# Patient Record
Sex: Female | Born: 1985 | Race: Asian | Marital: Married | Smoking: Never smoker
Health system: Southern US, Community
[De-identification: ages and names within clinical notes are randomized; demographics above are authoritative.]

## PROBLEM LIST (undated history)

## (undated) DIAGNOSIS — K219 Gastro-esophageal reflux disease without esophagitis: Secondary | ICD-10-CM

## (undated) DIAGNOSIS — O3502X0 Maternal care for (suspected) central nervous system malformation or damage in fetus, anencephaly, not applicable or unspecified: Secondary | ICD-10-CM

## (undated) DIAGNOSIS — O350XX0 Maternal care for (suspected) central nervous system malformation in fetus, not applicable or unspecified: Secondary | ICD-10-CM

## (undated) HISTORY — DX: Gastro-esophageal reflux disease without esophagitis: K21.9

## (undated) HISTORY — DX: Maternal care for (suspected) central nervous system malformation in fetus, not applicable or unspecified: O35.0XX0

## (undated) HISTORY — DX: Maternal care for (suspected) central nervous system malformation or damage in fetus, anencephaly, not applicable or unspecified: O35.02X0

## (undated) HISTORY — PX: DILATION AND CURETTAGE OF UTERUS: SHX78

---

## 2020-03-18 ENCOUNTER — Inpatient Hospital Stay (HOSPITAL_COMMUNITY): Admit: 2020-03-18 | Payer: 59 | Admitting: Obstetrics and Gynecology

## 2020-04-13 ENCOUNTER — Other Ambulatory Visit: Payer: Self-pay | Admitting: Obstetrics & Gynecology

## 2020-04-13 DIAGNOSIS — O283 Abnormal ultrasonic finding on antenatal screening of mother: Secondary | ICD-10-CM

## 2020-04-23 ENCOUNTER — Encounter: Payer: Self-pay | Admitting: *Deleted

## 2020-04-27 ENCOUNTER — Ambulatory Visit (HOSPITAL_BASED_OUTPATIENT_CLINIC_OR_DEPARTMENT_OTHER): Payer: 59 | Admitting: Maternal & Fetal Medicine

## 2020-04-27 ENCOUNTER — Ambulatory Visit: Payer: 59 | Admitting: *Deleted

## 2020-04-27 ENCOUNTER — Ambulatory Visit: Payer: 59 | Attending: Obstetrics & Gynecology

## 2020-04-27 ENCOUNTER — Encounter: Payer: Self-pay | Admitting: *Deleted

## 2020-04-27 ENCOUNTER — Other Ambulatory Visit: Payer: Self-pay

## 2020-04-27 VITALS — BP 104/68 | HR 104 | Ht 60.0 in

## 2020-04-27 DIAGNOSIS — O368930 Maternal care for other specified fetal problems, third trimester, not applicable or unspecified: Secondary | ICD-10-CM | POA: Diagnosis not present

## 2020-04-27 DIAGNOSIS — O09292 Supervision of pregnancy with other poor reproductive or obstetric history, second trimester: Secondary | ICD-10-CM | POA: Diagnosis not present

## 2020-04-27 DIAGNOSIS — Z148 Genetic carrier of other disease: Secondary | ICD-10-CM

## 2020-04-27 DIAGNOSIS — Q270 Congenital absence and hypoplasia of umbilical artery: Secondary | ICD-10-CM

## 2020-04-27 DIAGNOSIS — O09899 Supervision of other high risk pregnancies, unspecified trimester: Secondary | ICD-10-CM | POA: Insufficient documentation

## 2020-04-27 DIAGNOSIS — O283 Abnormal ultrasonic finding on antenatal screening of mother: Secondary | ICD-10-CM | POA: Insufficient documentation

## 2020-04-27 DIAGNOSIS — O359XX0 Maternal care for (suspected) fetal abnormality and damage, unspecified, not applicable or unspecified: Secondary | ICD-10-CM

## 2020-04-27 DIAGNOSIS — Z3A21 21 weeks gestation of pregnancy: Secondary | ICD-10-CM

## 2020-04-27 NOTE — Progress Notes (Signed)
MFM Consultation  Date of Service: 04/27/20 Reason for request: Ultrasound findings of choroid plexus cyst, echogenic intracardiac focus and single umbilical artery Referring provider: Mikey Kirschner, MD  Alyssa Rich is a 34 yo G3P0 at 32 w 1 d dated by LMP consistent with a 15 ultrasound. She is seen in consultation at the request of Dr. Layla Barter.  She is overall doing well without complaints. She denies preterm labor or preeclampsia.  She had a low risk NIPS and AFP.  She had a prior pregnancy first pregnancy diagnosed with anencephaly. Her subsequent pregnancy ended with an SAB. She had no terminations.   Vitals with BMI 04/27/2020 04/23/2020  Height - 5\' 0"   Systolic 104 -  Diastolic 68 -  Pulse 104 -   OB History  Gravida Para Term Preterm AB Living  3 0 0 0 2 0  SAB TAB Ectopic Multiple Live Births  0 2 0 0 0    # Outcome Date GA Lbr Len/2nd Weight Sex Delivery Anes PTL Lv  3 Current           2 TAB           1 TAB              Past Medical History:  Diagnosis Date  . 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 GERD (gastroesophageal reflux disease)    Past Surgical History:  Procedure Laterality Date  . DILATION AND CURETTAGE OF UTERUS     Social History   Socioeconomic History  . Marital status: Married    Spouse name: Not on file  . Number of children: Not on file  . Years of education: Not on file  . Highest education level: Not on file  Occupational History  . Not on file  Tobacco Use  . Smoking status: Never Smoker  . Smokeless tobacco: Never Used  Vaping Use  . Vaping Use: Never used  Substance and Sexual Activity  . Alcohol use: Never  . Drug use: Never  . Sexual activity: Not on file  Other Topics Concern  . Not on file  Social History Narrative  . Not on file   Social Determinants of Health   Financial Resource Strain:   . Difficulty of Paying Living Expenses: Not on file  Food Insecurity:   . Worried About in the Last Year: Not  on file  . Ran Out of Food in the Last Year: Not on file  Transportation Needs:   . Lack of Transportation (Medical): Not on file  . Lack of Transportation (Non-Medical): Not on file  Physical Activity:   . Days of Exercise per Week: Not on file  . Minutes of Exercise per Session: Not on file  Stress:   . Feeling of Stress : Not on file  Social Connections:   . Frequency of Communication with Friends and Family: Not on file  . Frequency of Social Gatherings with Friends and Family: Not on file  . Attends Religious Services: Not on file  . Active Member of Clubs or Organizations: Not on file  . Attends Programme researcher, broadcasting/film/video Meetings: Not on file  . Marital Status: Not on file  Intimate Partner Violence:   . Fear of Current or Ex-Partner: Not on file  . Emotionally Abused: Not on file  . Physically Abused: Not on file  . Sexually Abused: Not on file   Family History  Problem Relation Age of Onset  . Diabetes Mother   .  Depression Mother     Imaging:  Normal anatomy. No findings of CPC or EIF. Single umbilical artery is seen. 409 g at the 50%.   Impression/Counseling:  I reviewed with Alyssa Rich and her husband the normal nature of today's ultrasound with exception of the a single umbilical artery.  I shared that we did not observe the choroid plexus cyst or the echogenic intracardiac focus. In particular, I reviewed that in the context of low risk NIPS the risk are reduced.   Secondly, I discussed the single umbilical artery.  The normal human umbilical cord contains two arteries but only a single vein due to early atrophy of the left umbilical vein. In single umbilical artery (SUA), also called a two-vessel cord, there is only one artery and one vein. SUA occurs in approximately 1 of every 200 singletons and up to 5% of twins. Absent left artery is slightly more frequent than absent right artery (60%-70%).  Approximately one third of infants with SUA have additional structural  anomalies including cardiovascular, gastrointestinal (esophageal and anal atresias), and renal malformations.  Chromosomal anomalies, which are frequent in SUA, are uncommon in isolated SUA. Among cases of SUA with coexisting anomalies, 5%-50% are aneuploid. Fetal growth restriction (FGR), preterm birth, and perinatal mortality are increased in cases of SUA, especially with coexisting structural lesions.   In general, when a single umbilical artery is noted as an isolated finding and no other high-risk issues are noted, invasive karyotype analysis is usually not recommended.  Serial growth ultrasounds at approximately 4-6 week intervals are recommended. Antenatal surveillance can be initiated should abnormal fetal growth be detected.    Alyssa Rich and her husband declined a fetal echocardiogram today.   Recommendations: 1) Serial growth at 26-28, 32 and 36 weeks. 2) Initiate weekly testing if EFW or AC < 10th%.   These studies can be performed at your offices.    I spent 30 minute with > 50% in face to face consultation  All questions answered  Novella Olive, MD

## 2020-06-23 ENCOUNTER — Other Ambulatory Visit: Payer: Self-pay

## 2020-06-23 ENCOUNTER — Ambulatory Visit (HOSPITAL_COMMUNITY)
Admission: EM | Admit: 2020-06-23 | Discharge: 2020-06-23 | Disposition: A | Discharge status: Home / Self Care | Payer: 59 | Attending: Urgent Care | Admitting: Urgent Care

## 2020-06-23 ENCOUNTER — Encounter (HOSPITAL_COMMUNITY): Payer: Self-pay

## 2020-06-23 DIAGNOSIS — R070 Pain in throat: Secondary | ICD-10-CM | POA: Diagnosis present

## 2020-06-23 DIAGNOSIS — J069 Acute upper respiratory infection, unspecified: Secondary | ICD-10-CM

## 2020-06-23 DIAGNOSIS — B379 Candidiasis, unspecified: Secondary | ICD-10-CM | POA: Diagnosis not present

## 2020-06-23 LAB — POCT RAPID STREP A, ED / UC: Streptococcus, Group A Screen (Direct): NEGATIVE

## 2020-06-23 MED ORDER — CETIRIZINE HCL 10 MG PO TABS: 10.0000 mg | ORAL_TABLET | Freq: Every day | ORAL | 0 refills | Status: DC

## 2020-06-23 NOTE — ED Triage Notes (Signed)
Pt c/o in throat and scratchy throat where the white spots are at. X 2 days. Pt state she has had headaches and was feeling un easy. She states she garggled with warm water and salt. Pt denies sore throat.

## 2020-06-23 NOTE — Discharge Instructions (Signed)
We will manage this as a viral upper respiratory infection. For sore throat or cough try using a honey-based tea. Use 3 teaspoons of honey with juice squeezed from half lemon. Place shaved pieces of ginger into 1/2-1 cup of water and warm over stove top. Then mix the ingredients and repeat every 4 hours as needed. Please take Tylenol 500mg -650mg  once every 6 hours for fevers, aches and pains. Hydrate very well with at least 2 liters (64 ounces) of water. Eat light meals such as soups (chicken and noodles, chicken wild rice, vegetable).  Do not eat any foods that you are allergic to.  Start an antihistamine like Zyrtec, Allegra or Claritin for postnasal drainage, sinus congestion.

## 2020-06-23 NOTE — ED Provider Notes (Signed)
[facility]   MRN: [id number] DOB: September 16, 1985  Subjective:   Alyssa Rich is a 34 y.o. female presenting for 2 day hx of scratchy throat, white spot on her right tonsil. Would like to make sure she doesn't have strep. Patient is COVID vaccinated. Does not want COVID testing.   No current facility-administered medications for this encounter.  Current Outpatient Medications:  .  FOLIC ACID PO, Take by mouth., Disp: , Rfl:  .  Prenatal Vit-Fe Fumarate-FA (MULTIVITAMIN-PRENATAL) 27-0.8 MG TABS tablet, Take 1 tablet by mouth daily at 12 noon., Disp: , Rfl:    No Known Allergies  Past Medical History:  Diagnosis Date  . 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 GERD (gastroesophageal reflux disease)      Past Surgical History:  Procedure Laterality Date  . DILATION AND CURETTAGE OF UTERUS      Family History  Problem Relation Age of Onset  . Diabetes Mother   . Depression Mother     Social History   Tobacco Use  . Smoking status: Never Smoker  . Smokeless tobacco: Never Used  Vaping Use  . Vaping Use: Never used  Substance Use Topics  . Alcohol use: Never  . Drug use: Never    Review of Systems  Constitutional: Negative for chills, fever and malaise/fatigue.  HENT: Negative for congestion, ear pain and sinus pain.   Respiratory: Negative for cough and shortness of breath.   Cardiovascular: Negative for chest pain.  Gastrointestinal: Negative for abdominal pain.  Musculoskeletal: Negative for myalgias.  Skin: Negative for rash.     Objective:   Vitals: BP 94/62 (BP Location: Right Arm)   Pulse 99   Temp 98.1 F (36.7 C) (Oral)   Resp 16   LMP 12/01/2019 (Exact Date)   SpO2 100%   Physical Exam Constitutional:      General: She is not in acute distress.    Appearance: Normal appearance. She is well-developed. She is not ill-appearing, toxic-appearing or diaphoretic.  HENT:     Head: Normocephalic and atraumatic.     Nose: Nose normal.      Mouth/Throat:     Mouth: Mucous membranes are moist.     Pharynx: No oropharyngeal exudate or posterior oropharyngeal erythema.     Comments: <1/2 cm tonsillith over medial right tonsil.  Eyes:     General: No scleral icterus.       Right eye: No discharge.        Left eye: No discharge.     Extraocular Movements: Extraocular movements intact.     Conjunctiva/sclera: Conjunctivae normal.     Pupils: Pupils are equal, round, and reactive to light.  Cardiovascular:     Rate and Rhythm: Normal rate.  Pulmonary:     Effort: Pulmonary effort is normal.  Musculoskeletal:     Cervical back: Normal range of motion and neck supple. No tenderness.  Lymphadenopathy:     Cervical: No cervical adenopathy.  Skin:    General: Skin is warm and dry.  Neurological:     General: No focal deficit present.     Mental Status: She is alert and oriented to person, place, and time.  Psychiatric:        Mood and Affect: Mood normal.        Behavior: Behavior normal.     Results for orders placed or performed during the hospital encounter of 06/23/20 (from the past 24 hour(s))  POCT Rapid Strep A  Status: None   Collection Time: 06/23/20 11:16 AM  Result Value Ref Range   Streptococcus, Group A Screen (Direct) NEGATIVE NEGATIVE    Assessment and Plan :   PDMP not reviewed this encounter.  1. Viral URI   2. Throat discomfort     Suspect viral URI, viral syndrome; physical exam findings reassuring and vital signs stable for discharge. Advised supportive care, offered symptomatic relief. Patient refused COVID test. Strep culture pending. Counseled patient on potential for adverse effects with medications prescribed/recommended today, ER and return-to-clinic precautions discussed, patient verbalized understanding.     Wallis Bamberg, PA-C 06/23/20 1141

## 2020-06-25 LAB — CULTURE, GROUP A STREP (THRC)

## 2020-07-17 NOTE — L&D Delivery Note (Signed)
Delivery Note At 2:55 PM a viable and healthy female was delivered via Vaginal, Spontaneous (Presentation: Left Occiput Anterior).  APGAR: 8,9 ; weight pending .   Placenta status: Spontaneous;Expressed, Intact.  Cord: 2 vessels with the following complications: None.  Cord pH: n/a  Anesthesia: Epidural Episiotomy: None Lacerations: 3rd degree Suture Repair: 2.0 3.0 vicryl Est. Blood Loss (mL):  150cc  3b perineal laceration measuring > 2cm was appreciated at time of delivery. DRE performed that confirmed no occult 4th degree laceration present, rectal mucosa noted intact. The external anal sphincter complex was repaired with 2-0 Vicryl in an end to end fashion using interrupted sutures. The remaining 2nd degree was repaired in the usual running fashion with 3-0 Vicryl on a CT1. Restoration of normal anatomy and excellent hemostasis was achieved.   Mom to postpartum.  Baby to Couplet care / Skin to Skin.  Roland Lipke A Claxton Levitz 08/30/2020, 3:28 PM

## 2020-08-16 LAB — OB RESULTS CONSOLE GBS: GBS: POSITIVE

## 2020-08-25 ENCOUNTER — Telehealth (HOSPITAL_COMMUNITY): Payer: Self-pay | Admitting: *Deleted

## 2020-08-25 NOTE — Telephone Encounter (Signed)
Preadmission screen  

## 2020-08-26 ENCOUNTER — Encounter (HOSPITAL_COMMUNITY): Payer: Self-pay | Admitting: *Deleted

## 2020-08-26 ENCOUNTER — Telehealth (HOSPITAL_COMMUNITY): Payer: Self-pay | Admitting: *Deleted

## 2020-08-26 NOTE — Telephone Encounter (Signed)
Preadmission screen  

## 2020-08-27 ENCOUNTER — Other Ambulatory Visit: Payer: Self-pay | Admitting: Obstetrics & Gynecology

## 2020-08-28 ENCOUNTER — Other Ambulatory Visit (HOSPITAL_COMMUNITY)
Admission: RE | Admit: 2020-08-28 | Discharge: 2020-08-28 | Disposition: A | Discharge status: Home / Self Care | Payer: 59 | Source: Ambulatory Visit | Attending: Obstetrics & Gynecology | Admitting: Obstetrics & Gynecology

## 2020-08-28 DIAGNOSIS — Z01812 Encounter for preprocedural laboratory examination: Secondary | ICD-10-CM | POA: Insufficient documentation

## 2020-08-28 DIAGNOSIS — Z20822 Contact with and (suspected) exposure to covid-19: Secondary | ICD-10-CM | POA: Insufficient documentation

## 2020-08-28 LAB — SARS CORONAVIRUS 2 (TAT 6-24 HRS): SARS Coronavirus 2: NEGATIVE

## 2020-08-29 ENCOUNTER — Inpatient Hospital Stay (HOSPITAL_COMMUNITY)
Admission: AD | Admit: 2020-08-29 | Discharge: 2020-09-01 | DRG: 768 | Disposition: A | Discharge status: Home / Self Care | Payer: 59 | Attending: Obstetrics & Gynecology | Admitting: Obstetrics & Gynecology

## 2020-08-29 ENCOUNTER — Other Ambulatory Visit: Payer: Self-pay

## 2020-08-29 ENCOUNTER — Encounter (HOSPITAL_COMMUNITY): Payer: Self-pay | Admitting: Obstetrics and Gynecology

## 2020-08-29 DIAGNOSIS — O9902 Anemia complicating childbirth: Secondary | ICD-10-CM | POA: Diagnosis not present

## 2020-08-29 DIAGNOSIS — O09899 Supervision of other high risk pregnancies, unspecified trimester: Secondary | ICD-10-CM

## 2020-08-29 DIAGNOSIS — D62 Acute posthemorrhagic anemia: Secondary | ICD-10-CM | POA: Diagnosis not present

## 2020-08-29 DIAGNOSIS — O350XX0 Maternal care for (suspected) central nervous system malformation in fetus, not applicable or unspecified: Secondary | ICD-10-CM | POA: Diagnosis present

## 2020-08-29 DIAGNOSIS — Z3490 Encounter for supervision of normal pregnancy, unspecified, unspecified trimester: Secondary | ICD-10-CM | POA: Diagnosis present

## 2020-08-29 DIAGNOSIS — R339 Retention of urine, unspecified: Secondary | ICD-10-CM | POA: Diagnosis not present

## 2020-08-29 DIAGNOSIS — O9081 Anemia of the puerperium: Secondary | ICD-10-CM | POA: Diagnosis not present

## 2020-08-29 DIAGNOSIS — B951 Streptococcus, group B, as the cause of diseases classified elsewhere: Secondary | ICD-10-CM | POA: Diagnosis present

## 2020-08-29 DIAGNOSIS — O99824 Streptococcus B carrier state complicating childbirth: Secondary | ICD-10-CM | POA: Diagnosis present

## 2020-08-29 DIAGNOSIS — Z20822 Contact with and (suspected) exposure to covid-19: Secondary | ICD-10-CM | POA: Diagnosis present

## 2020-08-29 DIAGNOSIS — Z3A39 39 weeks gestation of pregnancy: Secondary | ICD-10-CM

## 2020-08-29 DIAGNOSIS — O99893 Other specified diseases and conditions complicating puerperium: Secondary | ICD-10-CM | POA: Diagnosis not present

## 2020-08-29 NOTE — MAU Note (Signed)
PT SAYS UC STRONG SINCE 7PM.  PNC WITH DR MODY - LAST WEEK- VE CLOSED. GBS- POSITIVE.

## 2020-08-30 ENCOUNTER — Encounter (HOSPITAL_COMMUNITY): Payer: Self-pay | Admitting: Obstetrics & Gynecology

## 2020-08-30 ENCOUNTER — Inpatient Hospital Stay (HOSPITAL_COMMUNITY): Payer: 59 | Admitting: Anesthesiology

## 2020-08-30 DIAGNOSIS — Z3490 Encounter for supervision of normal pregnancy, unspecified, unspecified trimester: Secondary | ICD-10-CM | POA: Diagnosis present

## 2020-08-30 DIAGNOSIS — R339 Retention of urine, unspecified: Secondary | ICD-10-CM | POA: Diagnosis not present

## 2020-08-30 DIAGNOSIS — O99893 Other specified diseases and conditions complicating puerperium: Secondary | ICD-10-CM | POA: Diagnosis not present

## 2020-08-30 DIAGNOSIS — Z3A39 39 weeks gestation of pregnancy: Secondary | ICD-10-CM | POA: Diagnosis not present

## 2020-08-30 DIAGNOSIS — O99824 Streptococcus B carrier state complicating childbirth: Secondary | ICD-10-CM | POA: Diagnosis present

## 2020-08-30 DIAGNOSIS — D62 Acute posthemorrhagic anemia: Secondary | ICD-10-CM | POA: Diagnosis not present

## 2020-08-30 DIAGNOSIS — O350XX0 Maternal care for (suspected) central nervous system malformation in fetus, not applicable or unspecified: Secondary | ICD-10-CM | POA: Diagnosis present

## 2020-08-30 DIAGNOSIS — O9081 Anemia of the puerperium: Secondary | ICD-10-CM | POA: Diagnosis not present

## 2020-08-30 DIAGNOSIS — Z20822 Contact with and (suspected) exposure to covid-19: Secondary | ICD-10-CM | POA: Diagnosis present

## 2020-08-30 DIAGNOSIS — O26893 Other specified pregnancy related conditions, third trimester: Secondary | ICD-10-CM | POA: Diagnosis present

## 2020-08-30 LAB — CBC
HCT: 39.2 % (ref 36.0–46.0)
Hemoglobin: 13.2 g/dL (ref 12.0–15.0)
MCH: 28 pg (ref 26.0–34.0)
MCHC: 33.7 g/dL (ref 30.0–36.0)
MCV: 83.1 fL (ref 80.0–100.0)
Platelets: 134 10*3/uL — ABNORMAL LOW (ref 150–400)
RBC: 4.72 MIL/uL (ref 3.87–5.11)
RDW: 13.8 % (ref 11.5–15.5)
WBC: 10.6 10*3/uL — ABNORMAL HIGH (ref 4.0–10.5)
nRBC: 0 % (ref 0.0–0.2)

## 2020-08-30 LAB — OB RESULTS CONSOLE RUBELLA ANTIBODY, IGM: Rubella: IMMUNE

## 2020-08-30 LAB — RPR: RPR Ser Ql: NONREACTIVE

## 2020-08-30 LAB — TYPE AND SCREEN
ABO/RH(D): O POS
Antibody Screen: NEGATIVE

## 2020-08-30 LAB — OB RESULTS CONSOLE RPR: RPR: NONREACTIVE

## 2020-08-30 LAB — OB RESULTS CONSOLE HIV ANTIBODY (ROUTINE TESTING): HIV: NONREACTIVE

## 2020-08-30 LAB — OB RESULTS CONSOLE HEPATITIS B SURFACE ANTIGEN: Hepatitis B Surface Ag: NEGATIVE

## 2020-08-30 MED ORDER — ACETAMINOPHEN 325 MG PO TABS
650.0000 mg | ORAL_TABLET | ORAL | Status: DC | PRN
  Administered 2020-08-30 (×2): 650 mg via ORAL
  Filled 2020-08-30 (×2): qty 2

## 2020-08-30 MED ORDER — OXYTOCIN-SODIUM CHLORIDE 30-0.9 UT/500ML-% IV SOLN: 2.5000 [IU]/h | INTRAVENOUS | Status: DC

## 2020-08-30 MED ORDER — PHENYLEPHRINE 40 MCG/ML (10ML) SYRINGE FOR IV PUSH (FOR BLOOD PRESSURE SUPPORT)
80.0000 ug | PREFILLED_SYRINGE | INTRAVENOUS | Status: DC | PRN
  Filled 2020-08-30: qty 10

## 2020-08-30 MED ORDER — FLEET ENEMA 7-19 GM/118ML RE ENEM: 1.0000 | ENEMA | RECTAL | Status: DC | PRN

## 2020-08-30 MED ORDER — LIDOCAINE HCL (PF) 1 % IJ SOLN: 30.0000 mL | INTRAMUSCULAR | Status: DC | PRN

## 2020-08-30 MED ORDER — SENNOSIDES-DOCUSATE SODIUM 8.6-50 MG PO TABS
2.0000 | ORAL_TABLET | Freq: Every day | ORAL | Status: DC
  Administered 2020-08-31 – 2020-09-01 (×2): 2 via ORAL
  Filled 2020-08-30 (×2): qty 2

## 2020-08-30 MED ORDER — EPHEDRINE 5 MG/ML INJ: 10.0000 mg | INTRAVENOUS | Status: DC | PRN

## 2020-08-30 MED ORDER — ACETAMINOPHEN 325 MG PO TABS
650.0000 mg | ORAL_TABLET | ORAL | Status: DC | PRN
  Administered 2020-08-30: 650 mg via ORAL
  Filled 2020-08-30: qty 2

## 2020-08-30 MED ORDER — OXYCODONE-ACETAMINOPHEN 5-325 MG PO TABS
1.0000 | ORAL_TABLET | ORAL | Status: DC | PRN
Start: 2020-08-30 — End: 2020-08-30

## 2020-08-30 MED ORDER — SODIUM CHLORIDE 0.9 % IV SOLN: 5.0000 10*6.[IU] | Freq: Once | INTRAVENOUS | Status: DC

## 2020-08-30 MED ORDER — SODIUM CHLORIDE 0.9 % IV SOLN
2.0000 g | Freq: Once | INTRAVENOUS | Status: AC
  Administered 2020-08-30: 2 g via INTRAVENOUS
  Filled 2020-08-30: qty 2000

## 2020-08-30 MED ORDER — OXYTOCIN-SODIUM CHLORIDE 30-0.9 UT/500ML-% IV SOLN
1.0000 m[IU]/min | INTRAVENOUS | Status: DC
  Administered 2020-08-30: 2 m[IU]/min via INTRAVENOUS

## 2020-08-30 MED ORDER — SOD CITRATE-CITRIC ACID 500-334 MG/5ML PO SOLN: 30.0000 mL | ORAL | Status: DC | PRN

## 2020-08-30 MED ORDER — TERBUTALINE SULFATE 1 MG/ML IJ SOLN: 0.2500 mg | Freq: Once | INTRAMUSCULAR | Status: DC | PRN

## 2020-08-30 MED ORDER — OXYTOCIN-SODIUM CHLORIDE 30-0.9 UT/500ML-% IV SOLN
2.5000 [IU]/h | INTRAVENOUS | Status: DC
  Administered 2020-08-30: 2.5 [IU]/h via INTRAVENOUS
  Filled 2020-08-30: qty 500

## 2020-08-30 MED ORDER — LACTATED RINGERS IV SOLN
500.0000 mL | INTRAVENOUS | Status: DC | PRN
Start: 2020-08-30 — End: 2020-08-30
  Administered 2020-08-30: 500 mL via INTRAVENOUS

## 2020-08-30 MED ORDER — ONDANSETRON HCL 4 MG/2ML IJ SOLN
4.0000 mg | Freq: Four times a day (QID) | INTRAMUSCULAR | Status: DC | PRN
  Administered 2020-08-30: 4 mg via INTRAVENOUS
  Filled 2020-08-30: qty 2

## 2020-08-30 MED ORDER — LACTATED RINGERS IV SOLN: 500.0000 mL | INTRAVENOUS | Status: DC | PRN

## 2020-08-30 MED ORDER — WITCH HAZEL-GLYCERIN EX PADS: 1.0000 "application " | MEDICATED_PAD | CUTANEOUS | Status: DC | PRN

## 2020-08-30 MED ORDER — ZOLPIDEM TARTRATE 5 MG PO TABS: 5.0000 mg | ORAL_TABLET | Freq: Every evening | ORAL | Status: DC | PRN

## 2020-08-30 MED ORDER — OXYTOCIN BOLUS FROM INFUSION
333.0000 mL | Freq: Once | INTRAVENOUS | Status: AC
  Administered 2020-08-30: 333 mL via INTRAVENOUS

## 2020-08-30 MED ORDER — BENZOCAINE-MENTHOL 20-0.5 % EX AERO
1.0000 "application " | INHALATION_SPRAY | CUTANEOUS | Status: DC | PRN
  Administered 2020-08-31: 1 via TOPICAL
  Filled 2020-08-30: qty 56

## 2020-08-30 MED ORDER — TETANUS-DIPHTH-ACELL PERTUSSIS 5-2.5-18.5 LF-MCG/0.5 IM SUSY: 0.5000 mL | PREFILLED_SYRINGE | Freq: Once | INTRAMUSCULAR | Status: DC

## 2020-08-30 MED ORDER — POLYETHYLENE GLYCOL 3350 17 G PO PACK
17.0000 g | PACK | Freq: Every day | ORAL | Status: DC
  Administered 2020-08-31 – 2020-09-01 (×2): 17 g via ORAL
  Filled 2020-08-30 (×3): qty 1

## 2020-08-30 MED ORDER — FENTANYL-BUPIVACAINE-NACL 0.5-0.125-0.9 MG/250ML-% EP SOLN
EPIDURAL | Status: DC | PRN
  Administered 2020-08-30: 12 mL/h via EPIDURAL

## 2020-08-30 MED ORDER — SIMETHICONE 80 MG PO CHEW: 80.0000 mg | CHEWABLE_TABLET | ORAL | Status: DC | PRN

## 2020-08-30 MED ORDER — SODIUM CHLORIDE 0.9 % IV SOLN
1.0000 g | INTRAVENOUS | Status: DC
  Administered 2020-08-30 (×3): 1 g via INTRAVENOUS
  Filled 2020-08-30 (×3): qty 1000

## 2020-08-30 MED ORDER — LIDOCAINE HCL (PF) 1 % IJ SOLN
INTRAMUSCULAR | Status: DC | PRN
  Administered 2020-08-30: 5 mL via EPIDURAL

## 2020-08-30 MED ORDER — PENICILLIN G POT IN DEXTROSE 60000 UNIT/ML IV SOLN: 3.0000 10*6.[IU] | INTRAVENOUS | Status: DC

## 2020-08-30 MED ORDER — LACTATED RINGERS IV SOLN: INTRAVENOUS | Status: DC

## 2020-08-30 MED ORDER — ONDANSETRON HCL 4 MG/2ML IJ SOLN: 4.0000 mg | INTRAMUSCULAR | Status: DC | PRN

## 2020-08-30 MED ORDER — LACTATED RINGERS IV SOLN
500.0000 mL | Freq: Once | INTRAVENOUS | Status: AC
  Administered 2020-08-30: 500 mL via INTRAVENOUS

## 2020-08-30 MED ORDER — DIBUCAINE (PERIANAL) 1 % EX OINT: 1.0000 "application " | TOPICAL_OINTMENT | CUTANEOUS | Status: DC | PRN

## 2020-08-30 MED ORDER — DIPHENHYDRAMINE HCL 25 MG PO CAPS: 25.0000 mg | ORAL_CAPSULE | Freq: Four times a day (QID) | ORAL | Status: DC | PRN

## 2020-08-30 MED ORDER — FENTANYL-BUPIVACAINE-NACL 0.5-0.125-0.9 MG/250ML-% EP SOLN: 12.0000 mL/h | EPIDURAL | Status: DC | PRN

## 2020-08-30 MED ORDER — OXYCODONE-ACETAMINOPHEN 5-325 MG PO TABS
2.0000 | ORAL_TABLET | ORAL | Status: DC | PRN
Start: 2020-08-30 — End: 2020-08-30

## 2020-08-30 MED ORDER — ONDANSETRON HCL 4 MG PO TABS: 4.0000 mg | ORAL_TABLET | ORAL | Status: DC | PRN

## 2020-08-30 MED ORDER — FENTANYL-BUPIVACAINE-NACL 0.5-0.125-0.9 MG/250ML-% EP SOLN
EPIDURAL | Status: AC
  Filled 2020-08-30: qty 250

## 2020-08-30 MED ORDER — DIPHENHYDRAMINE HCL 50 MG/ML IJ SOLN: 12.5000 mg | INTRAMUSCULAR | Status: DC | PRN

## 2020-08-30 MED ORDER — OXYTOCIN BOLUS FROM INFUSION: 333.0000 mL | Freq: Once | INTRAVENOUS | Status: DC

## 2020-08-30 MED ORDER — ONDANSETRON HCL 4 MG/2ML IJ SOLN: 4.0000 mg | Freq: Four times a day (QID) | INTRAMUSCULAR | Status: DC | PRN

## 2020-08-30 MED ORDER — COCONUT OIL OIL: 1.0000 "application " | TOPICAL_OIL | Status: DC | PRN

## 2020-08-30 MED ORDER — IBUPROFEN 600 MG PO TABS
600.0000 mg | ORAL_TABLET | Freq: Four times a day (QID) | ORAL | Status: DC
  Administered 2020-08-31 – 2020-09-01 (×6): 600 mg via ORAL
  Filled 2020-08-30 (×7): qty 1

## 2020-08-30 MED ORDER — PRENATAL MULTIVITAMIN CH
1.0000 | ORAL_TABLET | Freq: Every day | ORAL | Status: DC
  Administered 2020-08-31 – 2020-09-01 (×2): 1 via ORAL
  Filled 2020-08-30 (×2): qty 1

## 2020-08-30 MED ORDER — ACETAMINOPHEN 325 MG PO TABS: 650.0000 mg | ORAL_TABLET | ORAL | Status: DC | PRN

## 2020-08-30 MED ORDER — PHENYLEPHRINE 40 MCG/ML (10ML) SYRINGE FOR IV PUSH (FOR BLOOD PRESSURE SUPPORT)
80.0000 ug | PREFILLED_SYRINGE | INTRAVENOUS | Status: AC | PRN
  Administered 2020-08-30 (×3): 80 ug via INTRAVENOUS

## 2020-08-30 NOTE — Anesthesia Preprocedure Evaluation (Signed)
Anesthesia Evaluation  Patient identified by MRN, date of birth, ID band Patient awake    Reviewed: Allergy & Precautions, NPO status , Patient's Chart, lab work & pertinent test results  Airway Mallampati: II  TM Distance: >3 FB Neck ROM: Full    Dental no notable dental hx. (+) Teeth Intact, Dental Advisory Given   Pulmonary neg pulmonary ROS,    Pulmonary exam normal breath sounds clear to auscultation       Cardiovascular Exercise Tolerance: Good negative cardio ROS Normal cardiovascular exam Rhythm:Regular Rate:Normal     Neuro/Psych negative neurological ROS  negative psych ROS   GI/Hepatic Neg liver ROS, GERD  ,  Endo/Other  negative endocrine ROS  Renal/GU negative Renal ROS     Musculoskeletal   Abdominal   Peds  Hematology Lab Results      Component                Value               Date                      WBC                      10.6 (H)            08/30/2020                HGB                      13.2                08/30/2020                HCT                      39.2                08/30/2020                MCV                      83.1                08/30/2020                PLT                      134 (L)             08/30/2020              Anesthesia Other Findings   Reproductive/Obstetrics (+) Pregnancy                             Anesthesia Physical Anesthesia Plan  ASA: II  Anesthesia Plan: Epidural   Post-op Pain Management:    Induction:   PONV Risk Score and Plan:   Airway Management Planned:   Additional Equipment:   Intra-op Plan:   Post-operative Plan:   Informed Consent:   Plan Discussed with:   Anesthesia Plan Comments: (39 wk  G3P0 c ( Gestational Thrombocytopenia (134) for LEA)        Anesthesia Quick Evaluation

## 2020-08-30 NOTE — Lactation Note (Addendum)
This note was copied from a baby's chart. Lactation Consultation Note  Patient Name: Alyssa Rich [id number] Date: 08/30/2020 Reason for consult: L&D Initial assessment Age:34 hours  L&D consult with 66 minutes old infant and P1 mother. Parents are present at time of consult. Congratulated them on their newborn. Infant is skin to skin prone on mother's chest. Discussed STS as ideal transition for infants after birth helping with temperature, blood sugar and comfort. Talked about primal reflexes such as rooting, hands to mouth, searching for the breast among others.   No latch or hand expression assistance at this time. Mother expresses interest to exclusively pump. Explained LC services availability during postpartum stay. Thanked family for their time.    Maternal Data Does the patient have breastfeeding experience prior to this delivery?: No  Feeding Mother's Current Feeding Choice: Breast Milk  Interventions Interventions: Skin to skin;Expressed milk;Education  Consult Status Consult Status: Follow-up Date: 08/30/20 Follow-up type: In-patient    Yves Fodor A Higuera Ancidey 08/30/2020, 3:50 PM

## 2020-08-30 NOTE — Plan of Care (Signed)
Pt demonstrated understanding 

## 2020-08-30 NOTE — H&P (Addendum)
Alyssa Rich is a 35 y.o. G3P0020 at [redacted]w[redacted]d gestation presents for complaint of Contractions.  She denies vb, lof, +FM.  Antepartum course: CPC, EIF - resolved by MFM u/s; 2 vessel cord with nml growth  [facility] at [facility] since 11 wks.  See complete pre-natal records  History OB History    Gravida  3   Para      Term      Preterm      AB  2   Living  0     SAB      IAB  2   Ectopic      Multiple      Live Births             Past Medical History:  Diagnosis Date  . 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 GERD (gastroesophageal reflux disease)    Past Surgical History:  Procedure Laterality Date  . DILATION AND CURETTAGE OF UTERUS     Family History: family history includes Depression in her mother; Diabetes in her mother. Social History:  reports that she has never smoked. She has never used smokeless tobacco. She reports that she does not drink alcohol and does not use drugs.  ROS: See above otherwise negative  Prenatal labs:  ABO, Rh: --/--/O POS (02/14 0123) Antibody: NEG (02/14 0123) Rubella:  immune RPR:   nr HBsAg:   nr HIV:  neg GBS: Positive/-- (01/31 0000)  1 hr Glucola: Normal Genetic screening: Normal Anatomy US: Normal  Physical Exam:   Dilation: 7 Effacement (%): 90 Station: 0 Exam by:: Quin Lofton, RN Blood pressure (!) 102/57, pulse 61, temperature 98.4 F (36.9 C), temperature source Oral, resp. rate 16, height 5' (1.524 m), weight 56.8 kg, last menstrual period 12/01/2019, SpO2 100 %. A&O x 3 HEENT: Normal Lungs: CTAB CV: RRR Abdominal: Soft, Non-tender, Gravid and Estimated fetal weight: 7 lbs  Lower Extremities: Non-edematous, Non-tender  Pelvic Exam:      Dilatation: 6cm     Effacement: 80%     Station: -2     Presentation: Cephalic  arom with light meconium stain, moderate fluid  Labs:  CBC:  Lab Results  Component Value Date   WBC 10.6 (H) 08/30/2020   RBC 4.72 08/30/2020   HGB 13.2 08/30/2020   HCT 39.2  08/30/2020   MCV 83.1 08/30/2020   MCH 28.0 08/30/2020   MCHC 33.7 08/30/2020   RDW 13.8 08/30/2020   PLT 134 (L) 08/30/2020   CMP: No results found for: NA, K, CL, CO2, GLUCOSE, BUN, CREATININE, CALCIUM, PROT, AST, ALT, ALBUMIN, ALKPHOS, BILITOT, GFRNONAA, GFRAA, ANIONGAP Urine: No results found for: COLORURINE, APPEARANCEUR, LABSPEC, PHURINE, GLUCOSEU, HGBUR, BILIRUBINUR, KETONESUR, PROTEINUR, NITRITE, LEUKOCYTESUR   Prenatal Transfer Tool  Maternal Diabetes: No Genetic Screening: Normal Maternal Ultrasounds/Referrals: bilat CPC, EIF - resolved Fetal Ultrasounds or other Referrals:  None, declined fetal echo Maternal Substance Abuse:  No Significant Maternal Medications:  None Significant Maternal Lab Results: Group B Strep positive  FHT: 130s, nml variability, +accels, no decels TOCO: irreg, q 1-3 min  Assessment/Plan:  35 y.o. G3P0020 at [redacted]w[redacted]d gestation   1. Active labor - admit and plan svd; pt initially with cervical change then contractions decreased in frequency, now with pitocin augmentation; cervical exam not c/w nursing exam, much higher fetal station and 6cm; plan peanut ball and contin to increase pitocin  2. gbs positive - ampicillin for prophylaxis 3. H/o fetus with anencephaly and TOP at 24wga, normal u/s this pregnancy and  nml NIPS 4. RH pos 5. RI  6. 2 vessel cord with nml growth; 5'7", 55% at 34wga  Vick Frees 08/30/2020, 8:25 AM

## 2020-08-30 NOTE — Progress Notes (Signed)
Precautions were ordered on a previous admission

## 2020-08-30 NOTE — Progress Notes (Signed)
Office called for prenatal labs

## 2020-08-30 NOTE — Anesthesia Procedure Notes (Signed)
Epidural Patient location during procedure: OB Start time: 08/30/2020 4:20 AM End time: 08/30/2020 4:36 AM  Staffing Anesthesiologist: Trevor Iha, MD Performed: anesthesiologist   Preanesthetic Checklist Completed: patient identified, IV checked, site marked, risks and benefits discussed, surgical consent, monitors and equipment checked, pre-op evaluation and timeout performed  Epidural Patient position: sitting Prep: DuraPrep and site prepped and draped Patient monitoring: continuous pulse ox and blood pressure Approach: midline Location: L3-L4 Injection technique: LOR air  Needle:  Needle type: Tuohy  Needle gauge: 17 G Needle length: 9 cm and 9 Needle insertion depth: 4 cm Catheter type: closed end flexible Catheter size: 19 Gauge Catheter at skin depth: 9 cm Test dose: negative  Assessment Events: blood not aspirated, injection not painful, no injection resistance, no paresthesia and negative IV test  Additional Notes Patient identified. Risks/Benefits/Options discussed with patient including but not limited to bleeding, infection, nerve damage, paralysis, failed block, incomplete pain control, headache, blood pressure changes, nausea, vomiting, reactions to medication both or allergic, itching and postpartum back pain. Confirmed with bedside nurse the patient's most recent platelet count. Confirmed with patient that they are not currently taking any anticoagulation, have any bleeding history or any family history of bleeding disorders. Patient expressed understanding and wished to proceed. All questions were answered. Sterile technique was used throughout the entire procedure. Please see nursing notes for vital signs. Test dose was given through epidural needle and negative prior to continuing to dose epidural or start infusion. Warning signs of high block given to the patient including shortness of breath, tingling/numbness in hands, complete motor block, or any  concerning symptoms with instructions to call for help. Patient was given instructions on fall risk and not to get out of bed. All questions and concerns addressed with instructions to call with any issues.  1 Attempt (S) . Patient tolerated procedure well.

## 2020-08-31 ENCOUNTER — Inpatient Hospital Stay (HOSPITAL_COMMUNITY): Payer: 59

## 2020-08-31 ENCOUNTER — Inpatient Hospital Stay (HOSPITAL_COMMUNITY): Admission: RE | Admit: 2020-08-31 | Payer: 59 | Source: Home / Self Care | Admitting: Obstetrics & Gynecology

## 2020-08-31 DIAGNOSIS — O9902 Anemia complicating childbirth: Secondary | ICD-10-CM | POA: Diagnosis not present

## 2020-08-31 DIAGNOSIS — B951 Streptococcus, group B, as the cause of diseases classified elsewhere: Secondary | ICD-10-CM | POA: Diagnosis present

## 2020-08-31 LAB — CBC
HCT: 29.7 % — ABNORMAL LOW (ref 36.0–46.0)
Hemoglobin: 9.5 g/dL — ABNORMAL LOW (ref 12.0–15.0)
MCH: 27.2 pg (ref 26.0–34.0)
MCHC: 32 g/dL (ref 30.0–36.0)
MCV: 85.1 fL (ref 80.0–100.0)
Platelets: 112 10*3/uL — ABNORMAL LOW (ref 150–400)
RBC: 3.49 MIL/uL — ABNORMAL LOW (ref 3.87–5.11)
RDW: 13.7 % (ref 11.5–15.5)
WBC: 12.8 10*3/uL — ABNORMAL HIGH (ref 4.0–10.5)
nRBC: 0 % (ref 0.0–0.2)

## 2020-08-31 MED ORDER — MAGNESIUM OXIDE 400 (241.3 MG) MG PO TABS
400.0000 mg | ORAL_TABLET | Freq: Every day | ORAL | Status: DC
  Administered 2020-08-31 – 2020-09-01 (×2): 400 mg via ORAL
  Filled 2020-08-31 (×2): qty 1

## 2020-08-31 MED ORDER — POLYSACCHARIDE IRON COMPLEX 150 MG PO CAPS
150.0000 mg | ORAL_CAPSULE | Freq: Every day | ORAL | Status: DC
  Administered 2020-08-31 – 2020-09-01 (×2): 150 mg via ORAL
  Filled 2020-08-31 (×2): qty 1

## 2020-08-31 MED ORDER — FERROUS SULFATE 325 (65 FE) MG PO TABS
325.0000 mg | ORAL_TABLET | Freq: Every day | ORAL | Status: DC
Start: 2020-08-31 — End: 2020-08-31

## 2020-08-31 MED ORDER — FAMOTIDINE 20 MG PO TABS
20.0000 mg | ORAL_TABLET | Freq: Two times a day (BID) | ORAL | Status: DC | PRN
  Administered 2020-08-31 – 2020-09-01 (×2): 20 mg via ORAL
  Filled 2020-08-31 (×2): qty 1

## 2020-08-31 NOTE — Anesthesia Postprocedure Evaluation (Signed)
Anesthesia Post Note  Patient: Chiropractor  Procedure(s) Performed: AN AD HOC LABOR EPIDURAL     Patient location during evaluation: Mother Baby Anesthesia Type: Epidural Level of consciousness: awake and alert Pain management: pain level controlled Vital Signs Assessment: post-procedure vital signs reviewed and stable Respiratory status: spontaneous breathing, nonlabored ventilation and respiratory function stable Cardiovascular status: stable Postop Assessment: no headache, no backache and epidural receding Anesthetic complications: no   No complications documented.  Last Vitals:  Vitals:   08/31/20 0027 08/31/20 0523  BP: (!) 90/58 94/67  Pulse: (!) 115 99  Resp: 18 18  Temp: 36.8 C 36.7 C  SpO2: 99% 100%    Last Pain:  Vitals:   08/31/20 0755  TempSrc:   PainSc: 0-No pain   Pain Goal: Patients Stated Pain Goal: 0 (08/29/20 2108)              Epidural/Spinal Function Cutaneous sensation: Normal sensation (08/31/20 0755), Patient able to flex knees: Yes (08/31/20 0755), Patient able to lift hips off bed: Yes (08/31/20 0755), Back pain beyond tenderness at insertion site: No (08/31/20 0755), Progressively worsening motor and/or sensory loss: No (08/31/20 0755), Bowel and/or bladder incontinence post epidural: No (08/31/20 0755)  Junious Silk

## 2020-08-31 NOTE — Progress Notes (Signed)
Post Partum Day1 s/p SVD Subjective: Patient is doing well this morning. Does report some uterine cramping pain, taking Advil which does help. Also complaining of vaginal soreness from the stitches. Lochia WNL. Has not been able to spontaneously void overnight, has been straight cath x2. Ambulating without dizziness. Denies any HA, CP, or SOB. Passing gas. Tolerating PO.  Baby girl doing well at bedside. Working on breastfeeding. Per patient, baby did not pass hearing test and recommend repeat tomorrow. Planning for discharge home tomorrow.  Objective: Patient Vitals for the past 24 hrs:  BP Temp Temp src Pulse Resp SpO2  08/31/20 0523 94/67 98 F (36.7 C) Oral 99 18 100 %  08/31/20 0027 (!) 90/58 98.2 F (36.8 C) Oral (!) 115 18 99 %  08/30/20 2110 90/63 97.9 F (36.6 C) Oral 96 18 99 %  08/30/20 2000 94/65 98.1 F (36.7 C) Oral 95 18 98 %  08/30/20 1900 (!) 86/60 -- -- (!) 110 20 --  08/30/20 1830 (!) 83/59 -- -- (!) 113 16 --  08/30/20 1800 92/64 -- -- 99 20 --  08/30/20 1730 (!) 85/59 -- -- (!) 113 16 --  08/30/20 1700 90/60 -- -- (!) 103 20 --  08/30/20 1630 (!) 89/59 -- -- (!) 102 16 --  08/30/20 1615 (!) 93/58 -- -- 99 20 --  08/30/20 1600 (!) 111/49 -- -- 99 17 --  08/30/20 1545 91/62 -- -- (!) 105 18 --  08/30/20 1530 94/60 98.2 F (36.8 C) Oral 91 16 --  08/30/20 1518 97/66 -- -- 84 17 --  08/30/20 1502 94/81 -- -- 94 20 --  08/30/20 1433 101/60 -- -- 71 18 --  08/30/20 1401 107/71 98.7 F (37.1 C) Oral 72 17 --  08/30/20 1331 106/70 -- -- 76 19 --  08/30/20 1301 109/67 -- -- 70 16 --    Physical Exam:  General: alert and no distress Lochia: appropriate Uterine Fundus: firm DVT Evaluation: No evidence of DVT seen on physical exam.  Recent Labs    08/30/20 0123 08/31/20 0446  WBC 10.6* 12.8*  HGB 13.2 9.5*  HCT 39.2 29.7*  PLT 134* 112*    No results for input(s): NA, K, CL, CO2CT, BUN, CREATININE, GLUCOSE, BILITOT, ALT, AST, ALKPHOS, PROT, ALBUMIN in the  last 72 hours.  No results for input(s): CALCIUM, MG, PHOS in the last 72 hours.  No results for input(s): PROTIME, APTT, INR in the last 72 hours.  No results for input(s): PROTIME, APTT, INR, FIBRINOGEN in the last 72 hours. Assessment/Plan: Plan for discharge tomorrow  Alyssa Rich 35 y.o. [id number] PPD#1 sp SVD complicated by 3rd degree laceration 1. PPC: continue routine care with Motrin/Tylenol PRN pain regimen, regular diet, and encourage ambulation 2. Urinary Retention: plan for bladder rest x6-12hrs with indwelling catheter if unable to void on next TOV 3. 3rd degree laceration: reviewed nothing PR, cont standing bowel regimen to prevent constipation and encourage PO hydration, routine perineal care for comfort measures 4. Acute blood loss anemia Hgb 9.5, continue Niferex, pt asymptomatic 5. Lactation consult PRN 6. Rh POS, Rubella Immune 7. Plan for DC home tmw PPD2   LOS: 1 day   Alyssa Rich 08/31/2020, 12:47 PM

## 2020-08-31 NOTE — Lactation Note (Signed)
This note was copied from a baby's chart. Lactation Consultation Note  Patient Name: Alyssa Rich RSWNI'O Date: 08/31/2020 Reason for consult: Follow-up assessment;1st time breastfeeding;Primapara;Term;Hyperbilirubinemia;Infant weight loss Age:34 hours  Visited with mom of 25 hours old FT female, she's a P1 and reported (+) breast changes during the pregnancy. Mom originally planned on pumping and bottle feeding only, but she has been putting baby to breast, last LATCH score was 9.  Offered assistance with latch but mom politely declined, she said she just finished feeding baby at the breast. Asked mom to call for assistance when needed. She did voice that her left nipple (where baby fed) was sore, no signs of trauma upon breast examination. Mom aware that she can call for latch assistance if she needs more coaching, reviewed tips for a deep latch and prevention/treatment of sore nipples.  LC showed mom how to hand express and she was able to get some droplets of colostrum, LC rubbed it on her left nipple, praised her for her efforts. She has also been set up with a DEBP, baby's Tc bilirubin is elevated. Reviewed normal newborn behavior, feeding cues, lactogenesis II, size of baby's stomach and newborn jaundice.  Parents are aware of the availability of donor milk in case baby needs to be supplemented while at the hospital; still waiting for results of serum bilirubin.  Feeding plan:  1. Encouraged mom to feed baby STS 8-12 times/24 hours or sooner if feeding cues are present 2. She'll try pumping every 3 hours; and will offer any amount of EBM she may get to baby. 3. Parents will let their RN know if they'd like to start supplementing with donor milk (or formula) in case baby's bilirubin comes back at or above the high risk zone  BF brochure, BF resources and feeding diary were reviewed. FOB present and supportive, LC also showed him how to burp baby. Parents reported all questions and  concerns were answered, they're both aware of LC OP services and will call PRN.   Maternal Data    Feeding Mother's Current Feeding Choice: Breast Milk  LATCH Score                    Lactation Tools Discussed/Used Tools: Coconut oil;Pump Breast pump type: Double-Electric Breast Pump Pump Education: Setup, frequency, and cleaning Reason for Pumping: hyperbilirubinemia Pumping frequency: q 3 hours  Interventions Interventions: Breast feeding basics reviewed;Breast massage;Hand express;Coconut oil;Breast compression;DEBP  Discharge Pump: Manual (DEBP at home) [facility] Program: No  Consult Status Consult Status: Follow-up Date: 09/01/20 Follow-up type: In-patient    Alyssa Rich Alyssa Rich 08/31/2020, 4:24 PM

## 2020-09-01 MED ORDER — ACETAMINOPHEN 325 MG PO TABS: 650.0000 mg | ORAL_TABLET | ORAL | Status: DC | PRN

## 2020-09-01 MED ORDER — NITROFURANTOIN MONOHYD MACRO 100 MG PO CAPS: 100.0000 mg | ORAL_CAPSULE | Freq: Two times a day (BID) | ORAL | 0 refills | Status: AC

## 2020-09-01 MED ORDER — IBUPROFEN 600 MG PO TABS: 600.0000 mg | ORAL_TABLET | Freq: Four times a day (QID) | ORAL | 0 refills | Status: DC

## 2020-09-01 NOTE — Lactation Note (Signed)
This note was copied from a baby's chart. Lactation Consultation Note  Patient Name: Alyssa Rich ZOXWR'[id number] Date: 09/01/2020 Reason for consult: Follow-up assessment Age:35 hours  Mother is a P1, infantis now at 8% wt loss.   Reviewed hand expression with mother. Observed large drops of colostrum. 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  Mothers nipples are erect with compressible breast tissue. No observed trama of mothers nipples. .  Mother was observed with infant latched on at the Right  breast. Observed infant suckling with audible swallows. Infant sustained latch for 25 mins  #5 fr feeding tube used to entice infant to suckle infant took 10 ml and then another  6 ml , father to feed infant more formula with a bottle nipple as much as desired.   Parents very receptive to plan and the use of the fr feeding tube.   Discussed treatment and prevention of engorgement.  Plan of Care : Breastfeed infant with feeding cues Supplement infant with ebm/formula, according to supplemental guidelines. Pump using a DEBP after each feeding for 15-20 mins.   Mother to continue to cue base feed infant and feed at least 8-12 times or more in 24 hours and advised to allow for cluster feeding infant as needed.   Mother to continue to due STS. Mother is aware of available LC services at [facility], BFSG'S, OP Dept, and phone # for questions or concerns about breastfeeding.  Mother receptive to all teaching and plan of care.   Maternal Data    Feeding Mother's Current Feeding Choice: Breast Milk  LATCH Score Latch: Grasps breast easily, tongue down, lips flanged, rhythmical sucking.  Audible Swallowing: Spontaneous and intermittent  Type of Nipple: Everted at rest and after stimulation  Comfort (Breast/Nipple): Soft / non-tender  Hold (Positioning): Assistance needed to correctly position infant at breast and maintain latch.  LATCH Score:  9   Lactation Tools Discussed/Used Tools: 62F feeding tube / Syringe Breast pump type: Double-Electric Breast Pump  Interventions    Discharge Discharge Education: Engorgement and breast care;Warning signs for feeding baby Pump: DEBP America Brown)  Consult Status      Stevan Born [facility] 09/01/2020, 3:29 PM

## 2020-09-01 NOTE — Discharge Summary (Signed)
Postpartum Discharge Summary    Patient Name: Alyssa Rich DOB: March 26, 1986 MRN: [id number]  Date of admission: 08/29/2020 Delivery date:08/30/2020  Delivering provider: Langley Gauss Rich  Date of discharge: 09/01/2020  Admitting diagnosis: 58 weeks, active labor. GBS+.   Secondary diagnosis: Single umbilical artery affecting management of mother  Additional problems: EIF and bilateral CPC resolved at MFM f/up at 22 wks   Discharge diagnosis: SVD, Girl- 08/30/20         Third degree perineal laceration           Postpartum urinary retention. Going home with indwelling foley catheter                                         Post partum procedures:none Augmentation: Pitocin Complications: None  Hospital course: Onset of Labor With Vaginal Delivery      35 y.o. yo [id number] at 64w0dwas admitted in Active Labor on 08/29/2020. Patient had an uncomplicated labor course but long second stage  Membrane Rupture Time/Date: 8:31 AM ,08/30/2020   Delivery Method:Vaginal, Spontaneous  Episiotomy: None  Lacerations:  3rd degree  Postpartum complicated by urinary retention, has feeling of fullness but cant void. After 2 straight catheterization episodes, she had indwelling foley placed on 08/31/20 Patient is discharged home in stable condition on 09/01/20.  Newborn Data: Birth date:08/30/2020  Birth time:2:55 PM  Gender:Female  Living status:Living  Apgars:8 ,9  Weight:3050 g    MMR:N/Rich T-DaP:Given prenatally Flu: Yes Transfusion:No  Physical exam  Vitals:   08/31/20 0523 08/31/20 2129 08/31/20 2224 09/01/20 0509  BP: 94/67 (!) 84/65 97/72 104/70  Pulse: 99 96 91 96  Resp: 18 19 17 16   Temp: 98 F (36.7 C) 98 F (36.7 C)  97.8 F (36.6 C)  TempSrc: Oral Oral  Oral  SpO2: 100% 100% 100% 98%  Weight:      Height:       General: alert and cooperative Lochia: appropriate Uterine Fundus: firm Incision: No significant erythema DVT Evaluation: No evidence of DVT seen on physical  exam. Labs: Lab Results  Component Value Date   WBC 12.8 (H) 08/31/2020   HGB 9.5 (L) 08/31/2020   HCT 29.7 (L) 08/31/2020   MCV 85.1 08/31/2020   PLT 112 (L) 08/31/2020   No flowsheet data found. Edinburgh Score: Edinburgh Postnatal Depression Scale Screening Tool 09/01/2020  I have been able to laugh and see the funny side of things. 0  I have looked forward with enjoyment to things. 0  I have blamed myself unnecessarily when things went wrong. 3  I have been anxious or worried for no good reason. 2  I have felt scared or panicky for no good reason. 1  Things have been getting on top of me. 2  I have been so unhappy that I have had difficulty sleeping. 1  I have felt sad or miserable. 0  I have been so unhappy that I have been crying. 0  The thought of harming myself has occurred to me. 0  Edinburgh Postnatal Depression Scale Total 9      After visit meds: PNVit, Ibuprofen, Tylenol, Iron, stool softner                               Macrobid 100 mg po bid x 5 days   Discharge home  in stable condition Infant Feeding: Breast Infant Disposition:home with mother Discharge instruction: per After Visit Summary and Postpartum booklet. Activity: Advance as tolerated. Pelvic rest for 6 weeks.  Diet: routine diet Anticipated Birth Control: Unsure Postpartum Appointment:2-3 days  For voiding trial in office  Future Appointments 6 weeks for complete postpartum check    Follow-up Information    Alyssa Fallen, MD Follow up on 09/03/2020.   Specialty: Obstetrics and Gynecology Why: office will call with time  Contact information: [address] [place] [address] [phone]                   09/01/2020 Alyssa Royals, MD

## 2020-09-01 NOTE — Progress Notes (Signed)
Post Partum Day 2, SVD with 3rd degree perineal tear, urinary rentention, has indwelling foley since 1 pm yesterday  Subjective: Doing well, ambulating, foley in place, not ready for removal. Lochia wnl, perineal pain, well controlled   Objective: Blood pressure 104/70, pulse 96, temperature 97.8 F (36.6 C), temperature source Oral, resp. rate 16, height 5' (1.524 m), weight 56.8 kg, last menstrual period 12/01/2019, SpO2 98 %, unknown if currently breastfeeding.  Physical Exam:  General: alert and cooperative Lochia: appropriate Uterine Fundus: firm Incision: healing well DVT Evaluation: No evidence of DVT seen on physical exam.   Assessment/Plan: PPD #2, SVD, 3rd degree lac.  Acute urinary retention with cath x 2 yesterday and indwelling foley since. Plan voiding trial later on 2/18 Fri PM in [facility] office.  Breast feeding, lactation assisting  ABL anemia- add iron and mag-oxide  Pericare and PP care d/w pt. No depression.  Discharge home   LOS: 2 days   Robley Fries 09/01/2020, 8:27 AM

## 2020-09-01 NOTE — Social Work (Signed)
CSW received and acknowledges consult for EDPS of 9.  Consult screened out due to 9 on EDPS does not warrant a CSW consult.  MOB whom scores are greater than 9/yes to question 10 on Edinburgh Postpartum Depression Screen warrants a CSW consult.   Oluwatomisin Hustead, LCSWA Clinical Social Work [facility] [phone] 

## 2020-09-28 ENCOUNTER — Ambulatory Visit: Payer: Self-pay

## 2020-09-28 NOTE — Lactation Note (Signed)
This note was copied from a baby's chart. Lactation Consultation Note  Patient Name: Alyssa Rich JMEQA'S Date: 09/28/2020 Reason for consult: Initial assessment;Other (Comment);Primapara;1st time breastfeeding;Term (Pedis readmit - for fever due to UTI, baby is on IV antibiotics,baby is 44 weeks old. see LC note) Age:34 wk.o.  LC order - per mom baby has been breastfeeding , baby either doesn't latch for ever long or falls asleep. Milk never really came in at 3-5 days. Has been pumping with the DEBP 3-4 times day and only getting 5 ml , sometimes more.  The left nipple is painful when pumping with the #24 F.  Has a DEBP Spectra at home.  1st at the consult - had mom latch  The baby - see below - Latch score 9 , on and off for 10 mins . ( baby had recently fed at 1700 for 10 mins and supplemented afterwards . LC reviewed he set up of the DEBP - and mom mentioned it was working for her. LC identified the white membranes were not on the pump pieces and showed mom how to attach and why they were so important to make the pump work . Had mom pump both breast at the same time - #24 F on the right and changed the left to #27 due to mom saying it was uncomfortable . Per mom more comfortable with the #60F.  LC suspects due to latch of stimulation milk did not come to volume,  LC recommended taking the baby to the breast calm, if baby really fussy to latch or on and off , feed a portion of the bottle 1st and then breast.  Work on re- lactating - pumping both breast for 15 mins 8 times day and one of those pumps - a power pump ( 20 mins on 10 mins off over 60 mins ) .  Feed the EBM separate from the formula to insure baby receives all the EBM pumped. See below for details.   Maternal Data Has patient been taught Hand Expression?: Yes Does the patient have breastfeeding experience prior to this delivery?: No  Feeding Mother's Current Feeding Choice: Breast Milk and Formula  LATCH Score Latch: Grasps  breast easily, tongue down, lips flanged, rhythmical sucking. (on and off latch)  Audible Swallowing: A few with stimulation  Type of Nipple: Everted at rest and after stimulation  Comfort (Breast/Nipple): Soft / non-tender  Hold (Positioning): No assistance needed to correctly position infant at breast.  LATCH Score: 9   Lactation Tools Discussed/Used Tools: Pump;Flanges Flange Size: 24;27 (#24 F on the right and LC increased to #27 on the left due to mom saying the #24 F was to snug and painful. per mom more comfortable, even when the  LC pressed the let down button. LC had mom pump 15 mins - with EBM 6 ml/ more from the right.) Breast pump type: Double-Electric Breast Pump;Manual Pump Education: Milk Storage;Setup, frequency, and cleaning Reason for Pumping: mom desire to provide EBM and breast feed, Pumping frequency: per mom has been pumping 2-3 times a day . - see LC note Pumped volume: 5 mL  Interventions Interventions: Breast feeding basics reviewed;Skin to skin;Breast compression;Adjust position;Support pillows;Position options;Hand pump;DEBP;Education  Discharge Pump: Personal (per mom DEBP Spectra) WIC Program: No  Consult Status Consult Status: Complete    Kathrin Greathouse 09/28/2020, 6:46 PM

## 2021-03-12 IMAGING — US US MFM OB DETAIL+14 WK
1 series · 13 of 28 positions shown · non-contrast
Comparison: none

[Series 1: us mfm ob detail+14 wk · 145 acquisitions, 13 frames shown]
[im 6/145]
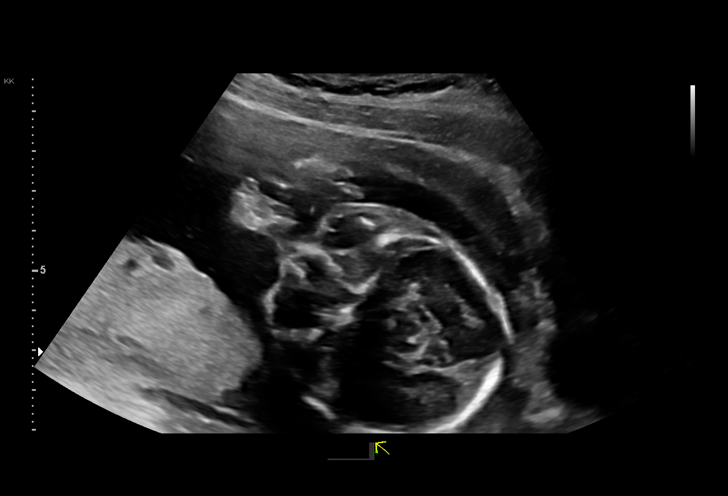
[im 17/145]
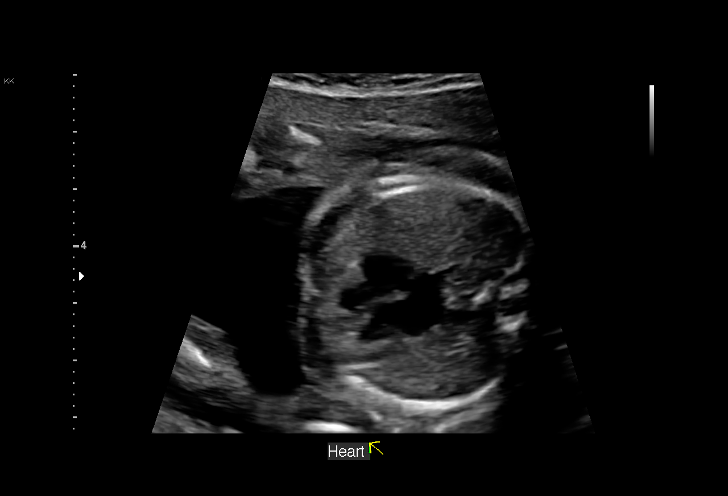
[im 27/145]
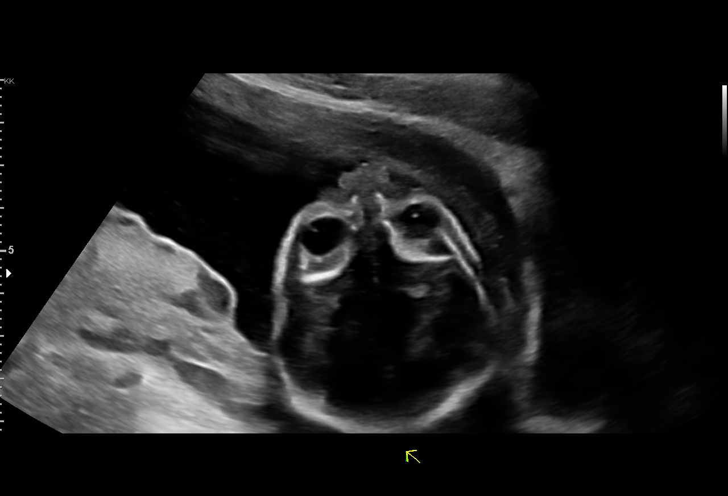
[im 38/145]
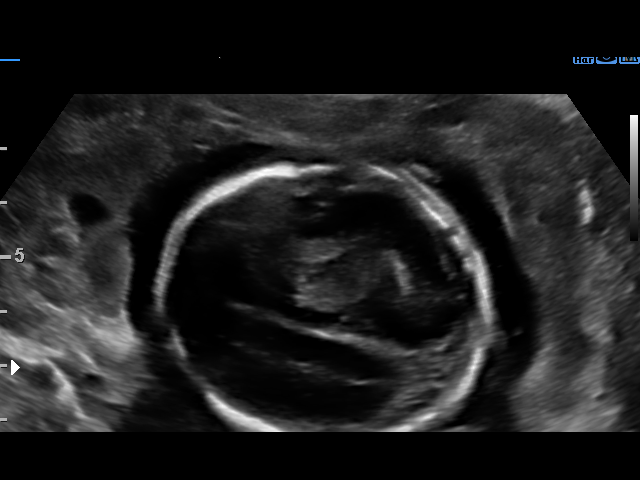
[im 49/145]
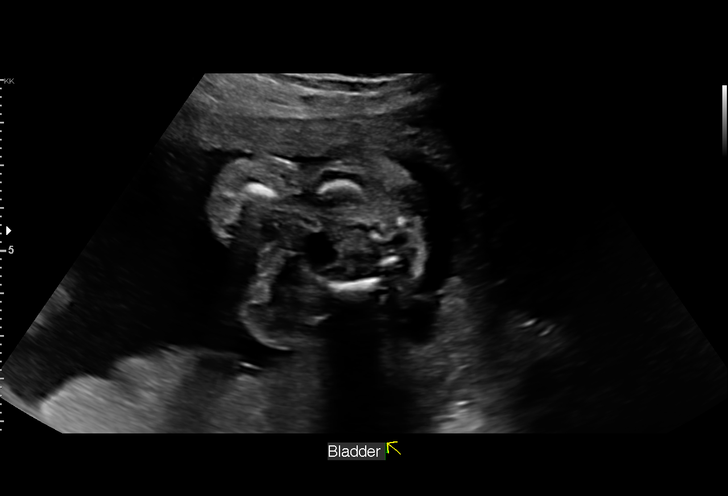
[im 59/145]
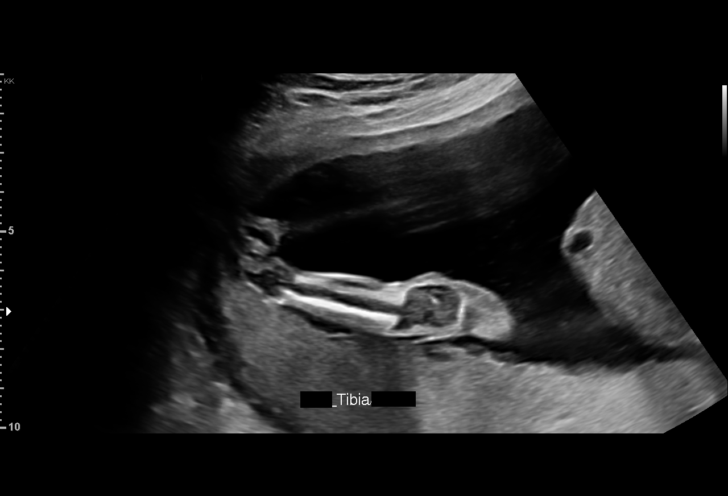
[im 75/145]
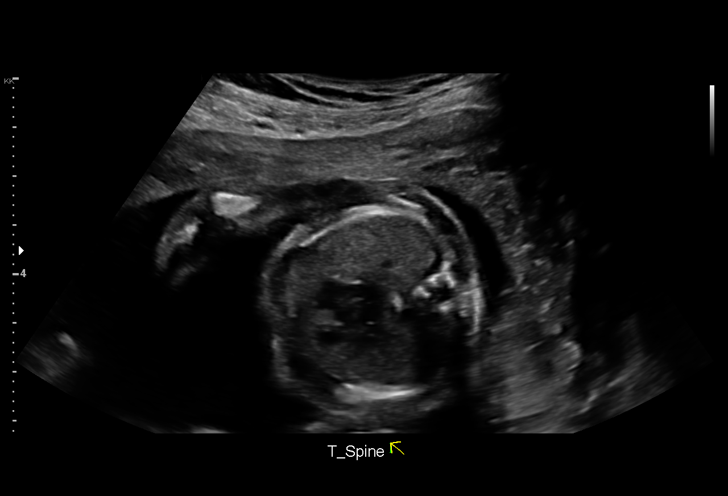
[im 86/145]
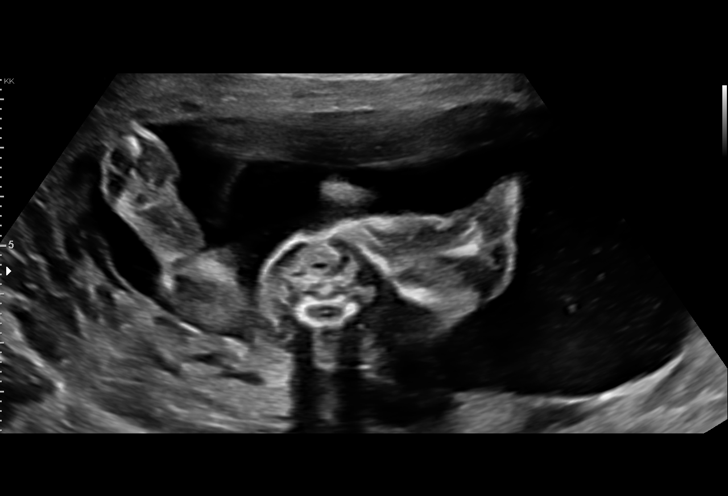
[im 97/145]
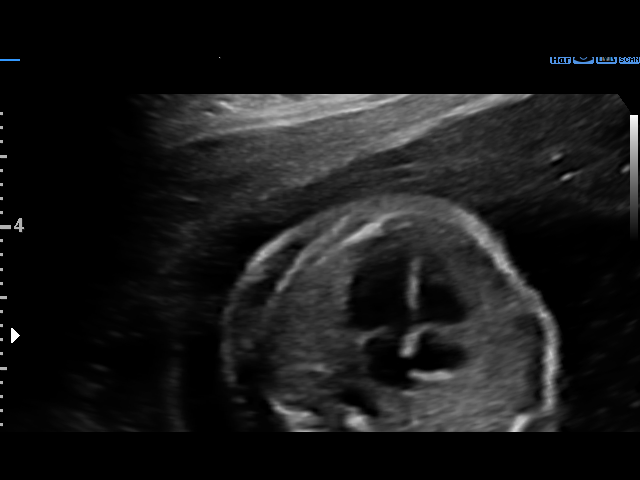
[im 107/145]
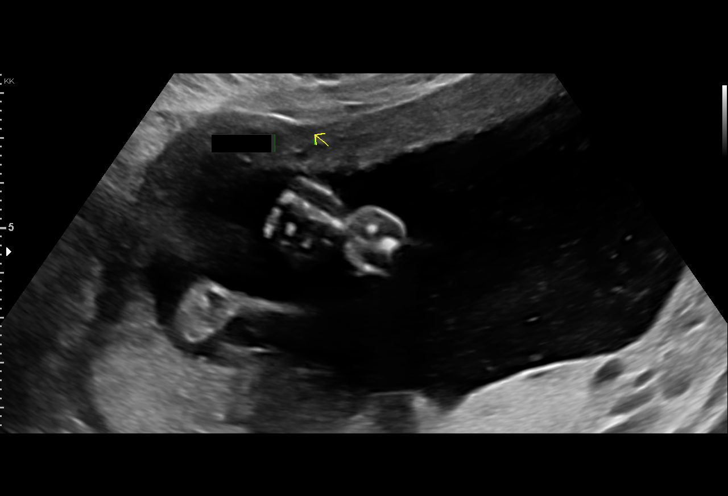
[im 118/145]
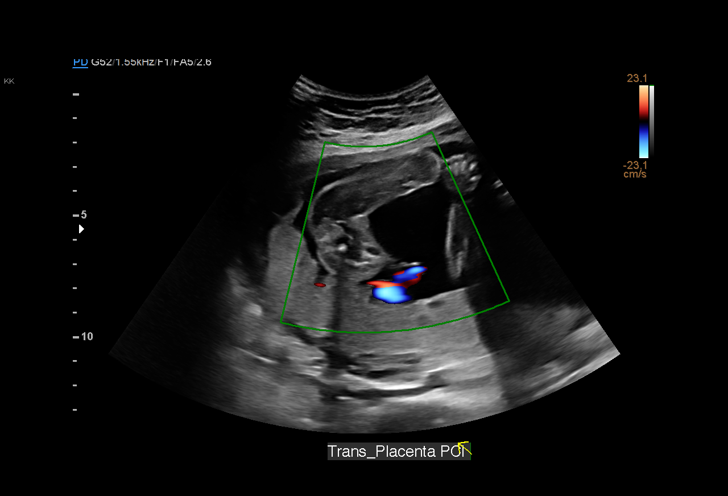
[im 129/145]
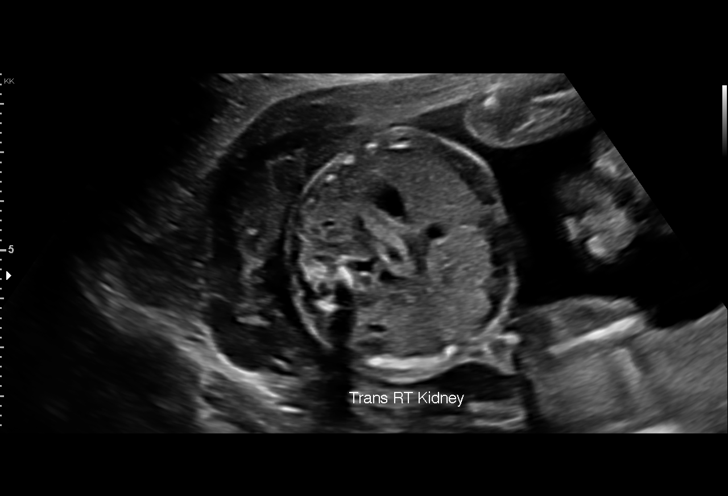
[im 139/145]
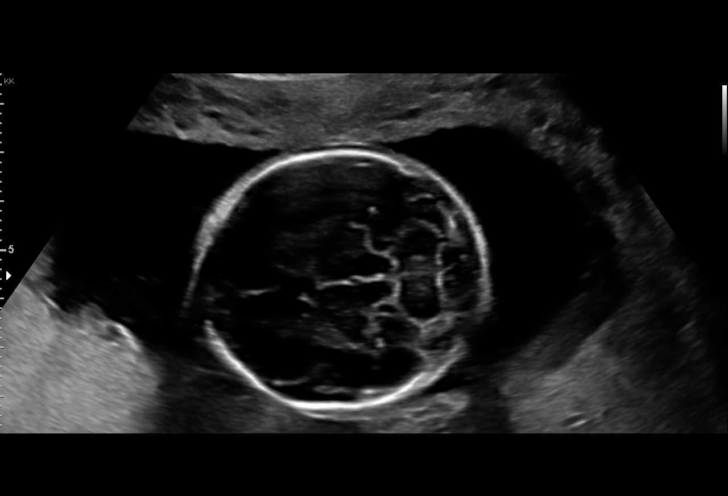

[13 of 28 positions shown; findings below may reference images not displayed]

Indications

 2 vessel umbilical cord
 Fetal abnormality - other known or
 suspected (EIF, 2VC, CPC)
 Low Risk NIPS,Negative AFP
 Genetic carrier (Dian Paw trait)
 History of fetal abnormality in previous
 pregnancy, currently pregnant (Anencephaly)
 21 weeks gestation of pregnancy
Fetal Evaluation

 Num Of Fetuses:         1
 Fetal Heart Rate(bpm):  160
 Cardiac Activity:       Observed
 Presentation:           Cephalic
 Placenta:               Posterior
 P. Cord Insertion:      Visualized

 Amniotic Fluid
 AFI FV:      Within normal limits

                             Largest Pocket(cm)

Biometry

 BPD:      52.4  mm     G. Age:  21w 6d         78  %    CI:        81.91   %    70 - 86
                                                         FL/HC:      19.0   %    15.9 -
 HC:      182.7  mm     G. Age:  20w 5d         20  %    HC/AC:      1.11        1.06 -
 AC:      164.7  mm     G. Age:  21w 4d         56  %    FL/BPD:     66.4   %
 FL:       34.8  mm     G. Age:  21w 0d         35  %    FL/AC:      21.1   %    20 - 24
 HUM:      32.7  mm     G. Age:  21w 0d         43  %

 Est. FW:     409  gm    0 lb 14 oz      50  %
OB History

 Gravidity:    3         Term:   0        Prem:   0        SAB:   0
 TOP:          2       Ectopic:  0        Living: 0
Gestational Age

 U/S Today:     21w 2d                                        EDD:   09/05/20
 Best:          21w 1d     Det. By:  Previous Ultrasound      EDD:   09/06/20
                                     (04/08/20)
Anatomy

 Cranium:               Appears normal         LVOT:                   Appears normal
 Cavum:                 Appears normal         Aortic Arch:            Appears normal
 Ventricles:            Appears normal         Ductal Arch:            Appears normal
 Choroid Plexus:        Appears normal         Diaphragm:              Appears normal
 Cerebellum:            Appears normal         Stomach:                Appears normal, left
                                                                       sided
 Posterior Fossa:       Appears normal         Abdomen:                Appears normal
 Nuchal Fold:           Not applicable (>20    Abdominal Wall:         Appears nml (cord
                        wks GA)                                        insert, abd wall)
 Face:                  Appears normal         Cord Vessels:           2 vessel cord,
                        (orbits and profile)
                                                                       absent right Dug[place]eriksson Verestin
 Lips:                  Appears normal         Kidneys:                Appear normal
 Palate:                Appears normal         Bladder:                Appears normal
 Thoracic:              Appears normal         Spine:                  Appears normal
 Heart:                 Appears normal         Upper Extremities:      Appears normal
                        (4CH, axis, and
                        situs)
 RVOT:                  Appears normal         Lower Extremities:      Appears normal

 Other:  Fetus appears to be female. Heels and 5th digit visualized. 3VV and
         3VTV visualized.
Cervix Uterus Adnexa

 Cervix
 Length:            3.4  cm.
 Normal appearance by transabdominal scan.
Impression

 Single intrauterine pregnancy with measurements consistent
 with dates
 A consult was performed today see [REDACTED] regarding Choroid
 plexus cysts, echogenic intracardiac focus and single
 umbilical artery seen on outside exam.
 A single umbilical artery was observed today, however a CPC
 and EIF were not seen. Ms. Tsehay has a low risk NIPS and
 AFP.

 Ms. Tsehay has a prior child with anencephaly and has taken
 4 mg of folic acid in early gestation.
 Again see [REDACTED] for consultation.
Recommendations

 Continue serial growth at 26-28, 32, and 36 weeks
 No echocardiogram elected by Ms. Tsehay.
 Follow up at our offices as clinically indicated.

## 2022-06-18 ENCOUNTER — Encounter (HOSPITAL_COMMUNITY): Payer: Self-pay

## 2022-06-18 ENCOUNTER — Ambulatory Visit (HOSPITAL_COMMUNITY)
Admission: RE | Admit: 2022-06-18 | Discharge: 2022-06-18 | Disposition: A | Discharge status: Home / Self Care | Payer: BC Managed Care – PPO | Source: Ambulatory Visit | Attending: Family Medicine | Admitting: Family Medicine

## 2022-06-18 VITALS — BP 117/80 | HR 76 | Temp 98.0°F | Resp 12

## 2022-06-18 DIAGNOSIS — L239 Allergic contact dermatitis, unspecified cause: Secondary | ICD-10-CM | POA: Diagnosis not present

## 2022-06-18 MED ORDER — PREDNISONE 20 MG PO TABS: 40.0000 mg | ORAL_TABLET | Freq: Every day | ORAL | 0 refills | Status: AC

## 2022-06-18 NOTE — ED Provider Notes (Signed)
[facility]    CSN: [id number] Arrival date & time: 06/18/22  1233      History   Chief Complaint Chief Complaint  Patient presents with   Rash   Appointment    12:30    HPI Alyssa Rich is a 36 y.o. female.    Rash  Here for pruritic rash patches on her upper and lower eyelids and on her neck.  It all began about 2 weeks ago.  She has had no fever or upper respiratory symptoms.  She has not had any trouble breathing.  No nausea or vomiting. Last menstrual cycle was November 10.  Is not taking any medications at the current time.   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    GERD (gastroesophageal reflux disease)     Patient Active Problem List   Diagnosis Date Noted   SVD (spontaneous vaginal delivery) 2/14 08/31/2020   Third degree perineal laceration 08/31/2020   Postpartum care following vaginal delivery 2/14 08/31/2020   Maternal anemia, with delivery 08/31/2020   Positive GBS test 08/31/2020   Indication for care in labor and delivery, antepartum 08/30/2020   Encounter for induction of labor 08/30/2020   Single umbilical artery affecting management of mother in singleton pregnancy, antepartum 04/27/2020    Past Surgical History:  Procedure Laterality Date   DILATION AND CURETTAGE OF UTERUS      OB History     Gravida  3   Para  1   Term  1   Preterm      AB  2   Living  1      SAB      IAB  2   Ectopic      Multiple  0   Live Births  1            Home Medications    Prior to Admission medications   Medication Sig Start Date End Date Taking? Authorizing Provider  predniSONE (DELTASONE) 20 MG tablet Take 2 tablets (40 mg total) by mouth daily with breakfast for 5 days. 06/18/22 06/23/22 Yes Zenia Resides, MD  acetaminophen (TYLENOL) 325 MG tablet Take 2 tablets (650 mg total) by mouth every 4 (four) hours as needed (for pain scale < 4). 09/01/20   Shea Evans, MD  famotidine  (PEPCID) 20 MG tablet Take 20 mg by mouth 2 (two) times daily.    [provider]  ferrous sulfate 325 (65 FE) MG tablet Take 325 mg by mouth daily with breakfast.    [provider]  folic acid (FOLVITE) 1 MG tablet Take 1 mg by mouth daily.    [provider]  magnesium oxide (MAG-OX) 400 MG tablet Take 400 mg by mouth daily.    [provider]  Prenatal Vit-Fe Fumarate-FA (PRENATAL MULTIVITAMIN) TABS tablet Take 1 tablet by mouth daily at 12 noon.    [provider]    Family History Family History  Problem Relation Age of Onset   Diabetes Mother    Depression Mother     Social History Social History   Tobacco Use   Smoking status: Never   Smokeless tobacco: Never  Vaping Use   Vaping Use: Never used  Substance Use Topics   Alcohol use: Never   Drug use: Never     Allergies   Patient has no known allergies.   Review of Systems Review of Systems  Skin:  Positive for rash.  Physical Exam Triage Vital Signs ED Triage Vitals  Enc Vitals Group     BP 06/18/22 1253 117/80     Pulse Rate 06/18/22 1253 76     Resp 06/18/22 1253 12     Temp 06/18/22 1253 98 F (36.7 C)     Temp Source 06/18/22 1253 Oral     SpO2 06/18/22 1253 100 %     Weight --      Height --      Head Circumference --      Peak Flow --      Pain Score 06/18/22 1257 0     Pain Loc --      Pain Edu? --      Excl. in GC? --    No data found.  Updated Vital Signs BP 117/80 (BP Location: Right Arm)   Pulse 76   Temp 98 F (36.7 C) (Oral)   Resp 12   LMP 05/26/2022   SpO2 100%   Visual Acuity Right Eye Distance:   Left Eye Distance:   Bilateral Distance:    Right Eye Near:   Left Eye Near:    Bilateral Near:     Physical Exam Vitals reviewed.  Constitutional:      General: She is not in acute distress.    Appearance: She is not ill-appearing, toxic-appearing or diaphoretic.  Skin:    Coloration: Skin is not jaundiced or pale.      Comments: There is a confluent patchy scaly rash on both lower eyelids and both upper eyelids.  She also has a similar patch on the left base of the anterior neck.  Neurological:     Mental Status: She is alert and oriented to person, place, and time.  Psychiatric:        Behavior: Behavior normal.      UC Treatments / Results  Labs (all labs ordered are listed, but only abnormal results are displayed) Labs Reviewed - No data to display  EKG   Radiology No results found.  Procedures Procedures (including critical care time)  Medications Ordered in UC Medications - No data to display  Initial Impression / Assessment and Plan / UC Course  I have reviewed the triage vital signs and the nursing notes.  Pertinent labs & imaging results that were available during my care of the patient were reviewed by me and considered in my medical decision making (see chart for details).        With somewhat of the rash being adjacent to her eyelid border, to treat with oral steroids instead of with the topical.  She Final Clinical Impressions(s) / UC Diagnoses   Final diagnoses:  Allergic contact dermatitis, unspecified trigger     Discharge Instructions      Take prednisone 20 mg--2 daily for 5 days   Follow-up appointment with your primary care     ED Prescriptions     Medication Sig Dispense Auth. Provider   predniSONE (DELTASONE) 20 MG tablet Take 2 tablets (40 mg total) by mouth daily with breakfast for 5 days. 10 tablet Marlinda Mike Janace Aris, MD      PDMP not reviewed this encounter.   Zenia Resides, MD 06/18/22 [phone]

## 2022-06-18 NOTE — Discharge Instructions (Signed)
Take prednisone 20 mg--2 daily for 5 days   Follow-up appointment with your primary care

## 2022-06-18 NOTE — ED Triage Notes (Signed)
Pt is here for rash on eye lids and neck x 2wks causing some itchiness.

## 2022-06-22 ENCOUNTER — Ambulatory Visit: Payer: BC Managed Care – PPO | Admitting: Internal Medicine

## 2022-06-22 ENCOUNTER — Encounter: Payer: Self-pay | Admitting: Internal Medicine

## 2022-06-22 VITALS — BP 90/60 | HR 84 | Temp 97.3°F | Ht 60.0 in | Wt 106.5 lb

## 2022-06-22 DIAGNOSIS — R5383 Other fatigue: Secondary | ICD-10-CM | POA: Diagnosis not present

## 2022-06-22 DIAGNOSIS — Z23 Encounter for immunization: Secondary | ICD-10-CM

## 2022-06-22 DIAGNOSIS — Z0000 Encounter for general adult medical examination without abnormal findings: Secondary | ICD-10-CM

## 2022-06-22 LAB — LIPID PANEL
Cholesterol: 226 mg/dL — ABNORMAL HIGH (ref 0–200)
HDL: 56.3 mg/dL (ref >39.00)
LDL Cholesterol: 156 mg/dL — ABNORMAL HIGH (ref 0–99)
NonHDL: 169.57
Total CHOL/HDL Ratio: 4
Triglycerides: 67 mg/dL (ref 0.0–149.0)
VLDL: 13.4 mg/dL (ref 0.0–40.0)

## 2022-06-22 LAB — COMPREHENSIVE METABOLIC PANEL
ALT: 27 U/L (ref 0–35)
AST: 15 U/L (ref 0–37)
Albumin: 4.4 g/dL (ref 3.5–5.2)
Alkaline Phosphatase: 49 U/L (ref 39–117)
BUN: 10 mg/dL (ref 6–23)
CO2: 24 mEq/L (ref 19–32)
Calcium: 9 mg/dL (ref 8.4–10.5)
Chloride: 105 mEq/L (ref 96–112)
Creatinine, Ser: 0.73 mg/dL (ref 0.40–1.20)
GFR: 106.81 mL/min (ref >60.00)
Glucose, Bld: 112 mg/dL — ABNORMAL HIGH (ref 70–99)
Potassium: 3.6 mEq/L (ref 3.5–5.1)
Sodium: 138 mEq/L (ref 135–145)
Total Bilirubin: 0.5 mg/dL (ref 0.2–1.2)
Total Protein: 7.7 g/dL (ref 6.0–8.3)

## 2022-06-22 LAB — CBC
HCT: 37.5 % (ref 36.0–46.0)
Hemoglobin: 12.3 g/dL (ref 12.0–15.0)
MCHC: 32.9 g/dL (ref 30.0–36.0)
MCV: 80.2 fl (ref 78.0–100.0)
Platelets: 167 10*3/uL (ref 150.0–400.0)
RBC: 4.68 Mil/uL (ref 3.87–5.11)
RDW: 13.4 % (ref 11.5–15.5)
WBC: 7.8 10*3/uL (ref 4.0–10.5)

## 2022-06-22 LAB — VITAMIN D 25 HYDROXY (VIT D DEFICIENCY, FRACTURES): VITD: 22.21 ng/mL — ABNORMAL LOW (ref 30.00–100.00)

## 2022-06-22 LAB — TSH: TSH: 0.36 u[IU]/mL (ref 0.35–5.50)

## 2022-06-22 LAB — VITAMIN B12: Vitamin B-12: 323 pg/mL (ref 211–911)

## 2022-06-22 NOTE — Progress Notes (Signed)
   Subjective:   Patient ID: Alyssa Rich, female    DOB: 1986-02-19, 36 y.o.   MRN: [id number]  Head Injury  Pertinent negatives include no vomiting.   The patient is a new 36 YO female coming in for fatigue and needs physical.  PMH, FMH, social history reviewed and updated  Review of Systems  Constitutional:  Positive for fatigue.  HENT: Negative.    Eyes: Negative.   Respiratory:  Negative for cough, chest tightness and shortness of breath.   Cardiovascular:  Negative for chest pain, palpitations and leg swelling.  Gastrointestinal:  Negative for abdominal distention, abdominal pain, constipation, diarrhea, nausea and vomiting.  Musculoskeletal: Negative.   Skin: Negative.   Neurological: Negative.   Psychiatric/Behavioral: Negative.      Objective:  Physical Exam Constitutional:      Appearance: She is well-developed.  HENT:     Head: Normocephalic and atraumatic.  Cardiovascular:     Rate and Rhythm: Normal rate and regular rhythm.  Pulmonary:     Effort: Pulmonary effort is normal. No respiratory distress.     Breath sounds: Normal breath sounds. No wheezing or rales.  Abdominal:     General: Bowel sounds are normal. There is no distension.     Palpations: Abdomen is soft.     Tenderness: There is no abdominal tenderness. There is no rebound.  Musculoskeletal:     Cervical back: Normal range of motion.  Skin:    General: Skin is warm and dry.  Neurological:     Mental Status: She is alert and oriented to person, place, and time.     Coordination: Coordination normal.     Vitals:   06/22/22 0829 06/22/22 0844  BP: (!) 86/60 90/60  Pulse: 84   Temp: (!) 97.3 F (36.3 C)   TempSrc: Oral   SpO2: 99%   Weight: 106 lb 8 oz (48.3 kg)   Height: 5' (1.524 m)     Assessment & Plan:  Flu shot given at visit

## 2022-06-22 NOTE — Assessment & Plan Note (Signed)
New problem and needs assessment. Checking CBC, CMP, ferritin, B12, vitamin D and TSH. Treat as appropriate. She did have anemia in pregnancy which she reports was not rechecked. Taking pre-natal.

## 2022-06-22 NOTE — Patient Instructions (Signed)
We have given you the flu shot and will check the labs. 

## 2022-06-22 NOTE — Assessment & Plan Note (Signed)
Flu shot given. Covid-19 counseled. Tetanus up to date. Pap smear up to date. Counseled about sun safety and mole surveillance. Counseled about the dangers of distracted driving. Given 10 year screening recommendations.   

## 2022-06-29 ENCOUNTER — Encounter: Payer: Self-pay | Admitting: Internal Medicine

## 2022-06-29 DIAGNOSIS — R21 Rash and other nonspecific skin eruption: Secondary | ICD-10-CM

## 2022-06-29 DIAGNOSIS — L299 Pruritus, unspecified: Secondary | ICD-10-CM

## 2022-06-29 DIAGNOSIS — H029 Unspecified disorder of eyelid: Secondary | ICD-10-CM

## 2022-07-17 ENCOUNTER — Ambulatory Visit (HOSPITAL_COMMUNITY)
Admission: EM | Admit: 2022-07-17 | Discharge: 2022-07-17 | Disposition: A | Discharge status: Home / Self Care | Payer: BC Managed Care – PPO | Attending: Nurse Practitioner | Admitting: Nurse Practitioner

## 2022-07-17 ENCOUNTER — Encounter (HOSPITAL_COMMUNITY): Payer: Self-pay

## 2022-07-17 DIAGNOSIS — H01111 Allergic dermatitis of right upper eyelid: Secondary | ICD-10-CM

## 2022-07-17 DIAGNOSIS — H01119 Allergic dermatitis of unspecified eye, unspecified eyelid: Secondary | ICD-10-CM

## 2022-07-17 DIAGNOSIS — H01114 Allergic dermatitis of left upper eyelid: Secondary | ICD-10-CM

## 2022-07-17 DIAGNOSIS — H01112 Allergic dermatitis of right lower eyelid: Secondary | ICD-10-CM | POA: Diagnosis not present

## 2022-07-17 DIAGNOSIS — L239 Allergic contact dermatitis, unspecified cause: Secondary | ICD-10-CM

## 2022-07-17 DIAGNOSIS — H01115 Allergic dermatitis of left lower eyelid: Secondary | ICD-10-CM | POA: Diagnosis not present

## 2022-07-17 MED ORDER — PREDNISONE 20 MG PO TABS: 40.0000 mg | ORAL_TABLET | Freq: Every day | ORAL | 0 refills | Status: AC

## 2022-07-17 MED ORDER — ERYTHROMYCIN 5 MG/GM OP OINT: TOPICAL_OINTMENT | OPHTHALMIC | 0 refills | Status: DC

## 2022-07-17 NOTE — Discharge Instructions (Signed)
Prednisone daily for 5 days Erythromycin ointment to the right lower eyelid 4 times a day for 7 days Continue your Claritin and topical Benadryl cream to affected areas Please follow-up with your PCP for further treatment options if symptoms or not improving

## 2022-07-17 NOTE — ED Triage Notes (Addendum)
Pt is here for rash on face.  Pt state right eye has been burning and watery x 1 month

## 2022-07-17 NOTE — ED Provider Notes (Signed)
[name]    CSN: [id number] Arrival date & time: 07/17/22  1128      History   Chief Complaint Chief Complaint  Patient presents with   Rash    HPI Alyssa Rich is a 37 y.o. female who presents for evaluation of a rash that has been ongoing for 1 month. Patient reports associated symptoms of a pruritic red rash on her temples, forehead, and bilateral eyelids.  Denies any drainage, warmth, fever, chills.  Was using a new face cream prior to onset but is since stopped.  States rash comes and goes.  She reports she saw her PCP in early December and was prescribed Claritin and topical Benadryl cream which she has been using.  She did stop it intermittently and notices symptoms returned.  She recently stopped it on 24 December and then restarted on December 25 but states she has had no improvement.  She was seen in urgent care as well and prescribed prednisone and she states this was the most helpful.  She denies using any face soaps, lotions, make-ups etc. since symptoms started.  No history of eczema.  No other concerns at this time.     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 Authorizing Provider  erythromycin ophthalmic ointment Place a 1/2 inch ribbon of ointment into the right lower eyelid four times a day for 7 days 07/17/22  Yes Melynda Ripple, NP  predniSONE  (DELTASONE) 20 MG tablet Take 2 tablets (40 mg total) by mouth daily with breakfast for 5 days. 07/17/22 07/22/22 Yes Melynda Ripple, NP  famotidine (PEPCID) 20 MG tablet Take 20 mg by mouth 2 (two) times daily.    [provider]  Prenatal Vit-Fe Fumarate-FA (PRENATAL MULTIVITAMIN) TABS tablet Take 1 tablet by mouth daily at 12 noon.    [provider]    Family History Family History  Problem Relation Age of Onset   Diabetes Mother    Depression Mother     Social History Social History   Tobacco Use   Smoking status: Never   Smokeless tobacco: Never  Vaping Use   Vaping Use: Never used  Substance Use Topics   Alcohol use: Never   Drug use: Never     Allergies   Patient has no known allergies.   Review of Systems Review of Systems  Skin:  Positive for rash.     Physical Exam Triage Vital Signs ED Triage Vitals  Enc Vitals Group     BP 07/17/22 1210 133/76     Pulse Rate  07/17/22 1210 100     Resp 07/17/22 1210 12     Temp 07/17/22 1210 98.3 F (36.8 C)     Temp Source 07/17/22 1210 Oral     SpO2 07/17/22 1210 98 %     Weight --      Height --      Head Circumference --      Peak Flow --      Pain Score 07/17/22 1208 0     Pain Loc --      Pain Edu? --      Excl. in [place]? --    No data found.  Updated Vital Signs BP 133/76 (BP Location: Right Arm)   Pulse 100   Temp 98.3 F (36.8 C) (Oral)   Resp 12   LMP 06/25/2022 Comment: Patient is currently on her cycle 12/6  SpO2 98%   Visual Acuity Right Eye Distance:   Left Eye Distance:   Bilateral Distance:    Right Eye Near:   Left Eye Near:    Bilateral Near:     Physical Exam Vitals and nursing note reviewed.  Constitutional:      General: She is not in acute distress.    Appearance: Normal appearance. She is not ill-appearing.  HENT:     Head: Normocephalic and atraumatic.  Eyes:     Pupils: Pupils are equal, round, and reactive to light.  Cardiovascular:     Rate and  Rhythm: Normal rate.  Pulmonary:     Effort: Pulmonary effort is normal.  Skin:    General: Skin is warm and dry.     Comments: Patchy scaly rash noted on upper and lower eyelids bilaterally, right temple, and left neck.  No swelling, drainage. Skin is cracked under right lower eyelid with minimal swelling at this site.   Neurological:     General: No focal deficit present.     Mental Status: She is alert and oriented to person, place, and time.  Psychiatric:        Mood and Affect: Mood normal.        Behavior: Behavior normal.      UC Treatments / Results  Labs (all labs ordered are listed, but only abnormal results are displayed) Labs Reviewed - No data to display  EKG   Radiology No results found.  Procedures Procedures (including critical care time)  Medications Ordered in UC Medications - No data to display  Initial Impression / Assessment and Plan / UC Course  I have reviewed the triage vital signs and the nursing notes.  Pertinent labs & imaging results that were available during my care of the patient were reviewed by me and considered in my medical decision making (see chart for details).     Discussed with patient exam and symptoms consistent with dermatitis Prednisone x 5 days She is to continue Claritin and topical Benadryl Advised Aquaphor ointment to affected areas to hydrate the skin Will start erythromycin ointment to the right lower eyelid given cracked open skin with mild swelling Advised to follow-up with her PCP in 2 to 3 days for recheck ER precautions reviewed and she verbalized understanding Final Clinical Impressions(s) / UC Diagnoses   Final diagnoses:  Allergic dermatitis  Eyelid dermatitis, allergic/contact     Discharge Instructions      Prednisone daily for 5 days Erythromycin ointment to the right lower eyelid 4 times a day for 7 days Continue your Claritin and topical Benadryl cream to affected areas  Please follow-up with  your PCP for further treatment options if symptoms or not improving   ED Prescriptions     Medication Sig Dispense Auth. Provider   erythromycin ophthalmic ointment Place a 1/2 inch ribbon of ointment into the right lower eyelid four times a day for 7 days 3.5 g Melynda Ripple, NP   predniSONE (DELTASONE) 20 MG tablet Take 2 tablets (40 mg total) by mouth daily with breakfast for 5 days. 10 tablet Melynda Ripple, NP      PDMP not reviewed this encounter.   Melynda Ripple, NP 07/17/22 1242

## 2022-07-24 MED ORDER — PREDNISONE 20 MG PO TABS: 20.0000 mg | ORAL_TABLET | Freq: Every day | ORAL | 0 refills | Status: DC

## 2022-07-24 NOTE — Addendum Note (Signed)
Addended by: Biagio Borg on: 07/24/2022 05:07 PM   Modules accepted: Orders

## 2022-08-09 NOTE — Addendum Note (Signed)
Addended by: Pricilla Holm A on: 08/09/2022 11:43 AM   Modules accepted: Orders

## 2022-08-18 ENCOUNTER — Telehealth: Payer: Self-pay

## 2022-08-18 MED ORDER — BETAMETHASONE VALERATE 0.1 % EX OINT: 1.0000 | TOPICAL_OINTMENT | Freq: Two times a day (BID) | CUTANEOUS | 0 refills | Status: DC

## 2022-08-18 NOTE — Addendum Note (Signed)
Addended by: Hoyt Koch on: 08/18/2022 02:38 PM   Modules accepted: Orders

## 2022-08-18 NOTE — Telephone Encounter (Signed)
Patients husband called and wanted the number for th referral for the allergist

## 2022-09-13 ENCOUNTER — Ambulatory Visit: Payer: BC Managed Care – PPO | Admitting: Allergy

## 2022-09-13 ENCOUNTER — Encounter: Payer: Self-pay | Admitting: Allergy

## 2022-09-13 ENCOUNTER — Other Ambulatory Visit: Payer: Self-pay

## 2022-09-13 VITALS — BP 102/64 | HR 88 | Temp 98.3°F | Resp 12 | Ht 60.0 in | Wt 105.3 lb

## 2022-09-13 DIAGNOSIS — L2389 Allergic contact dermatitis due to other agents: Secondary | ICD-10-CM

## 2022-09-13 DIAGNOSIS — L2089 Other atopic dermatitis: Secondary | ICD-10-CM

## 2022-09-13 MED ORDER — PREDNISONE 10 MG PO TABS: ORAL_TABLET | ORAL | 0 refills | Status: DC

## 2022-09-13 MED ORDER — DESONIDE 0.05 % EX OINT: 1.0000 | TOPICAL_OINTMENT | Freq: Two times a day (BID) | CUTANEOUS | 2 refills | Status: AC | PRN

## 2022-09-13 NOTE — Patient Instructions (Addendum)
You most likely have contact dermatitis. Start prednisone taper. Prednisone '10mg'$  tablets - take 2 tablets for 4 days then 1 tablet on day 5.  Use desonide 0.05% ointment twice a day as needed for mild rash flares - okay to use on the face, neck, groin area. Do not use more than 1 week at a time. See below for proper skin care. Use fragrance free and dye free products. No dryer sheets or fabric softener. No nail polish or make up.  Return for patch testing. Patches are best placed on Monday with return to office on Wednesday and Friday of same week for readings.  Patches once placed should not get wet.  You do not have to stop any medications for patch testing but should not be on oral prednisone. You can schedule a patch testing visit when convenient for your schedule.   True Test looks for the following sensitivities:      Follow up in 1 month for patch testing.  Skin care recommendations  Bath time: Always use lukewarm water. AVOID very hot or cold water. Keep bathing time to 5-10 minutes. Do NOT use bubble bath. Use a mild soap and use just enough to wash the dirty areas. Do NOT scrub skin vigorously.  After bathing, pat dry your skin with a towel. Do NOT rub or scrub the skin.  Moisturizers and prescriptions:  ALWAYS apply moisturizers immediately after bathing (within 3 minutes). This helps to lock-in moisture. Use the moisturizer several times a day over the whole body. Good summer moisturizers include: Aveeno, CeraVe, Cetaphil. Good winter moisturizers include: Aquaphor, Vaseline, Cerave, Cetaphil, Eucerin, Vanicream. When using moisturizers along with medications, the moisturizer should be applied about one hour after applying the medication to prevent diluting effect of the medication or moisturize around where you applied the medications. When not using medications, the moisturizer can be continued twice daily as maintenance.  Laundry and clothing: Avoid laundry products  with added color or perfumes. Use unscented hypo-allergenic laundry products such as Tide free, Cheer free & gentle, and All free and clear.  If the skin still seems dry or sensitive, you can try double-rinsing the clothes. Avoid tight or scratchy clothing such as wool. Do not use fabric softeners or dyer sheets.

## 2022-09-13 NOTE — Assessment & Plan Note (Signed)
Facial rash mainly periorbitally for the last 3 months.  She used new moisturizer when this occurred but stopped 2 months ago and still having the same issues.  Systemic steroids and topical steroids do help.  She does get her hair dyed and wear nail polish and make-up.  No prior history of eczema or contact dermatitis.You most likely have contact dermatitis. Discussed with patient that based on distribution she has contact dermatitis.  Most likely for something that she is using above her neck or on her hands. No indication for any environmental or food testing at this time. Start prednisone taper. Prednisone '10mg'$  tablets - take 2 tablets for 4 days then 1 tablet on day 5.  Use desonide 0.05% ointment twice a day as needed for mild rash flares - okay to use on the face, neck, groin area. Do not use more than 1 week at a time. See below for proper skin care. Use fragrance free and dye free products. No dryer sheets or fabric softener. No nail polish or make up. Return for TRUE patch testing.

## 2022-09-13 NOTE — Progress Notes (Signed)
New Patient Note  RE: Alyssa Rich MRN: [id number] DOB: 03/22/1986 Date of Office Visit: 09/13/2022  Consult requested by: Hoyt Koch, * Primary care provider: Hoyt Koch, MD  Chief Complaint: Rash, Pruritus, and Pain (Pt states skin around the eyes are very rough itchy and burning sensation x 3 month. Also on hands.)  History of Present Illness: I had the pleasure of seeing Alyssa Rich for initial evaluation at the [facility] on 09/13/2022. She is a 37 y.o. female, who is referred here by Hoyt Koch, MD for the evaluation of eyelid rash.  Rash started about 3 months ago. Mainly occurs on her face, some on the hands. Describes them as itchy, red, dry. Associated symptoms include: none but then mentions dizziness at times - most likely unrelated.  Frequency of episodes: daily unless she takes steroids. Suspected triggers are unknown - but noticed moisturizers trigger her symptoms.  Denies any fevers, chills, changes in medications, foods or recent infections. Patient used a new moisturizer when this started happening but she stopped using it 2 months ago. She dyed her hair 15 days ago and does not recall getting her hair done right before onset.  Usually she does wear nail polish.    She has tried the following therapies: prednisone with good benefit. Systemic steroids yes. Currently on daily antihistamines.  Previous work up includes: none. Previous history of rash/hives: no.  Assessment and Plan: Alyssa Rich is a 37 y.o. female with: Allergic contact dermatitis due to other agents Facial rash mainly periorbitally for the last 3 months.  She used new moisturizer when this occurred but stopped 2 months ago and still having the same issues.  Systemic steroids and topical steroids do help.  She does get her hair dyed and wear nail polish and make-up.  No prior history of eczema or contact dermatitis.You most likely have contact  dermatitis. Discussed with patient that based on distribution she has contact dermatitis.  Most likely for something that she is using above her neck or on her hands. No indication for any environmental or food testing at this time. Start prednisone taper. Prednisone '10mg'$  tablets - take 2 tablets for 4 days then 1 tablet on day 5.  Use desonide 0.05% ointment twice a day as needed for mild rash flares - okay to use on the face, neck, groin area. Do not use more than 1 week at a time. See below for proper skin care. Use fragrance free and dye free products. No dryer sheets or fabric softener. No nail polish or make up. Return for TRUE patch testing.  Return for Patch testing.  Meds ordered this encounter  Medications   desonide (DESOWEN) 0.05 % ointment    Sig: Apply 1 Application topically 2 (two) times daily as needed (mild rash flare). Okay to use on the face, neck, groin area. Do not use more than 1 week at a time.    Dispense:  60 g    Refill:  2   predniSONE (DELTASONE) 10 MG tablet    Sig: Start prednisone taper. Prednisone '10mg'$  tablets - take 2 tablets for 4 days then 1 tablet on day 5.    Dispense:  9 tablet    Refill:  0   Lab Orders  No laboratory test(s) ordered today    Other allergy screening: Asthma: no Rhino conjunctivitis:  Sometimes has symptoms when around dust.  Food allergy: no Medication allergy: no Hymenoptera allergy: no Urticaria: no History  of recurrent infections suggestive of immunodeficency: no  Diagnostics: None.   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 30 g 0   desonide (DESOWEN) 0.05 % ointment Apply 1 Application topically 2 (two) times daily as needed (mild rash flare). Okay to use on the face, neck, groin area. Do not use more than 1 week at a time. 60 g 2   erythromycin ophthalmic ointment Place a 1/2 inch ribbon of ointment into the right lower eyelid four times a day for 7 days 3.5 g 0   famotidine (PEPCID) 20 MG tablet Take 20 mg by mouth 2 (two) times daily.     predniSONE (DELTASONE) 10 MG tablet Start prednisone taper. Prednisone '10mg'$  tablets - take 2 tablets for 4 days then 1 tablet on day 5. 9 tablet 0   Prenatal Vit-Fe Fumarate-FA (PRENATAL MULTIVITAMIN) TABS tablet Take 1 tablet by mouth daily at 12 noon.     No current facility-administered medications for this visit.   Allergies: No Known Allergies Social History: Social History   Socioeconomic History   Marital status: Married    Spouse name: Not on file   Number of children: Not on file   Years of education: Not on file   Highest education level: Not on file  Occupational History   Not on file  Tobacco Use   Smoking status: Never    Passive exposure: Never   Smokeless tobacco: Never  Vaping Use   Vaping Use: Never used  Substance and Sexual Activity   Alcohol use: Never   Drug use: Never   Sexual activity: Yes    Birth control/protection: Pill  Other Topics Concern   Not on file  Social History Narrative   Not on file   Social Determinants of Health   Financial Resource Strain: Not on file  Food Insecurity: Not on file  Transportation Needs: Not on file  Physical Activity: Not on file  Stress: Not on file  Social Connections: Not on file   Lives in an apartment. Smoking: denies Occupation: not employed  Environmental  HistoryFreight forwarder in the house: no Charity fundraiser in the family room: yes Carpet in the bedroom: yes Heating: gas Cooling: central Pet: no  Family History: Family History  Problem Relation Age of Onset   Asthma Mother    Allergic rhinitis Mother    Diabetes Mother    Depression Mother    Review of Systems  Constitutional:  Negative for appetite change, chills, fever and unexpected weight change.  HENT:  Negative for congestion and rhinorrhea.   Eyes:  Negative for itching.  Respiratory:  Negative for cough, chest tightness, shortness of breath and wheezing.   Cardiovascular:  Negative for chest pain.  Gastrointestinal:  Negative for abdominal pain.  Genitourinary:  Negative for difficulty urinating.  Skin:  Positive for rash.  Neurological:  Negative for headaches.    Objective: BP  102/64   Pulse 88   Temp 98.3 F (36.8 C)   Resp 12   Ht 5' (1.524 m)   Wt 105 lb 4.8 oz (47.8 kg)   SpO2 100%   BMI 20.56 kg/m  Body mass index is 20.56 kg/m. Physical Exam Vitals and nursing note reviewed.  Constitutional:      Appearance: Normal appearance. She is well-developed.  HENT:     Head: Normocephalic and atraumatic.     Right Ear: Tympanic membrane and external ear normal.     Left Ear: Tympanic membrane and external ear normal.     Nose: Nose normal.     Mouth/Throat:     Mouth: Mucous membranes are moist.     Pharynx: Oropharynx is clear.  Eyes:     Conjunctiva/sclera: Conjunctivae normal.  Cardiovascular:     Rate and Rhythm: Normal rate and regular rhythm.     Heart sounds: Normal heart sounds. No murmur heard.    No friction rub. No gallop.  Pulmonary:     Effort: Pulmonary effort is normal.     Breath sounds: Normal breath sounds. No wheezing, rhonchi or rales.  Musculoskeletal:     Cervical back: Neck supple.  Skin:    General: Skin is warm.     Findings: Rash present.     Comments: Periorbital erythema b/l. Small eczematous patch on right wrist  dorsal aspect of hand.   Neurological:     Mental Status: She is alert and oriented to person, place, and time.  Psychiatric:        Behavior: Behavior normal.   The plan was reviewed with the patient/family, and all questions/concerned were addressed.  It was my pleasure to see Alyssa Rich today and participate in her care. Please feel free to contact me with any questions or concerns.  Sincerely,  Rexene Alberts, DO Allergy & Immunology  [facility] office: [place] office: [phone]

## 2022-09-18 ENCOUNTER — Telehealth: Payer: Self-pay | Admitting: Internal Medicine

## 2022-09-18 NOTE — Telephone Encounter (Signed)
Caller states patient would like to transfer care from Dr. Pricilla Holm to [facility]. Is this okay?

## 2022-09-18 NOTE — Telephone Encounter (Signed)
Fine with me

## 2022-10-02 ENCOUNTER — Encounter: Payer: Self-pay | Admitting: Allergy

## 2022-10-12 ENCOUNTER — Ambulatory Visit: Payer: BC Managed Care – PPO | Admitting: Physician Assistant

## 2022-10-13 ENCOUNTER — Ambulatory Visit: Payer: Self-pay | Admitting: Internal Medicine

## 2022-10-16 ENCOUNTER — Ambulatory Visit: Payer: BC Managed Care – PPO | Admitting: Family

## 2022-10-16 ENCOUNTER — Other Ambulatory Visit: Payer: Self-pay

## 2022-10-16 ENCOUNTER — Encounter: Payer: Self-pay | Admitting: Family

## 2022-10-16 VITALS — BP 92/60 | HR 84 | Temp 98.6°F | Resp 16 | Wt 104.6 lb

## 2022-10-16 DIAGNOSIS — L2389 Allergic contact dermatitis due to other agents: Secondary | ICD-10-CM

## 2022-10-16 NOTE — Progress Notes (Signed)
Follow-up Note  RE: Alyssa Rich MRN: [id number] DOB: 1986-07-16 Date of Office Visit: 10/16/2022  Primary care provider: Hoyt Koch, MD Referring provider: Gweneth Dimitri returns to the office today for the patch test placement, given suspected history of contact dermatitis.    Diagnostics: True Test patches placed.    Plan:   Allergic contact dermatitis - Instructions provided on care of the patches for the next 48 hours. - Alyssa Rich was instructed to avoid showering for the next 48 hours. - Alyssa Rich will follow up in 48 hours and 96 hours for patch readings.   Althea Charon, FNP [facility]

## 2022-10-18 ENCOUNTER — Encounter: Payer: Self-pay | Admitting: Family Medicine

## 2022-10-18 ENCOUNTER — Ambulatory Visit: Payer: BC Managed Care – PPO | Admitting: Family Medicine

## 2022-10-18 DIAGNOSIS — L2389 Allergic contact dermatitis due to other agents: Secondary | ICD-10-CM

## 2022-10-18 NOTE — Progress Notes (Signed)
    Follow-up Note  RE: Alyssa Rich MRN: [id number] DOB: [birth year] Date of Office Visit: 10/18/2022  Primary care provider: Hoyt Koch, MD Referring provider: Gweneth Dimitri returns to the office today for the initial patch test interpretation, given suspected history of contact dermatitis.    Diagnostics:   TRUE TEST 48-hour hour reading:  possible reaction to #28 (Gold sodium thiosulfate)   Plan:   Allergic contact dermatitis - The patient has been provided detailed information regarding the substances she is sensitive to, as well as products containing the substances.   - Meticulous avoidance of these substances is recommended.  - If avoidance is not possible, the use of barrier creams or lotions is recommended. - If symptoms persist or progress despite meticulous avoidance of gold sodium thiosulfate, a dermatology referral may be warranted.  Call the clinic if this treatment plan is not working well for you  Follow up in 2 days or sooner if needed.  Thank you for the opportunity to care for this patient.  Please do not hesitate to contact me with questions.  Gareth Morgan, FNP [facility]

## 2022-10-18 NOTE — Patient Instructions (Signed)
Diagnostics:   TRUE TEST 48-hour hour reading: possible reaction to #28 (Gold sodium thiosulfate)   Plan:   Allergic contact dermatitis - The patient has been provided detailed information regarding the substances she is sensitive to, as well as products containing the substances.   - Meticulous avoidance of these substances is recommended.  - If avoidance is not possible, the use of barrier creams or lotions is recommended. - If symptoms persist or progress despite meticulous avoidance of gold sodium thiosulfate, a dermatology referral may be warranted.  Call the clinic if this treatment plan is not working well for you  Follow up in 2 days or sooner if needed.

## 2022-10-19 NOTE — Progress Notes (Addendum)
    Follow-up Note  RE: Alyssa Rich MRN: [id number] DOB: 06-28-1986 Date of Office Visit: 10/20/2022  Primary care provider: Myrlene Broker, MD Referring provider: Verne Grain returns to the office today for the final patch test interpretation, given suspected history of contact dermatitis.    Diagnostics:   TRUE TEST 96-hour hour reading: negative to the true test patch  True test possible positive to gold thiosulfate on 10/18/2022  Plan:   Allergic contact dermatitis - The patient has been provided detailed information regarding the substances she is sensitive to, as well as products containing the substances.   - Meticulous avoidance of these substances is recommended.  - If avoidance is not possible, the use of barrier creams or lotions is recommended. - If symptoms persist or progress despite meticulous avoidance of gold sodium thiosulfate, a dermatology referral may be warranted. Continue a twice a day moisturizing routine Begin Protopic to red itchy areas up to twice a day as needed Continue desonide 0.05% ointment twice a day as needed for mild rash flares - okay to use on the face, neck, groin area. Do not use more than 1 week at a time.   Thank you for the opportunity to care for this patient.  Please do not hesitate to contact me with questions.  Thermon Leyland, FNP [facility]

## 2022-10-20 ENCOUNTER — Ambulatory Visit: Payer: BC Managed Care – PPO | Admitting: Family Medicine

## 2022-10-20 ENCOUNTER — Encounter: Payer: Self-pay | Admitting: Family Medicine

## 2022-10-20 DIAGNOSIS — L2389 Allergic contact dermatitis due to other agents: Secondary | ICD-10-CM

## 2022-10-20 MED ORDER — TACROLIMUS 0.1 % EX OINT: TOPICAL_OINTMENT | Freq: Two times a day (BID) | CUTANEOUS | 2 refills | Status: DC

## 2022-10-20 NOTE — Patient Instructions (Addendum)
Diagnostics:   TRUE TEST 96-hour hour reading: negative to the true test patch   Plan:   Allergic contact dermatitis - The patient has been provided detailed information regarding the substances she is sensitive to, as well as products containing the substances.   - Meticulous avoidance of these substances is recommended.  - If avoidance is not possible, the use of barrier creams or lotions is recommended. - If symptoms persist or progress despite meticulous avoidance of gold sodium thiosulfate, a dermatology referral may be warranted. Continue a twice a day moisturizing routine Begin Protopic to red itchy areas up to twice a day as needed Continue desonide 0.05% ointment twice a day as needed for mild rash flares - okay to use on the face, neck, groin area. Do not use more than 1 week at a time.   Call the clinic if this treatment plan is not working well for you  Follow up in 1 month or sooner if needed.

## 2022-10-25 ENCOUNTER — Encounter: Payer: Self-pay | Admitting: Family Medicine

## 2022-10-25 NOTE — Telephone Encounter (Signed)
Continue a twice a day moisturizing routine and you can use tacrolimus on your nipples up to twice a day as needed.

## 2022-10-30 ENCOUNTER — Encounter: Payer: Self-pay | Admitting: Physician Assistant

## 2022-10-30 ENCOUNTER — Ambulatory Visit: Payer: BC Managed Care – PPO | Admitting: Physician Assistant

## 2022-10-30 VITALS — BP 82/54 | HR 75 | Temp 98.2°F | Ht 60.0 in | Wt 105.6 lb

## 2022-10-30 DIAGNOSIS — M549 Dorsalgia, unspecified: Secondary | ICD-10-CM | POA: Diagnosis not present

## 2022-10-30 DIAGNOSIS — L2389 Allergic contact dermatitis due to other agents: Secondary | ICD-10-CM

## 2022-10-30 DIAGNOSIS — K130 Diseases of lips: Secondary | ICD-10-CM | POA: Diagnosis not present

## 2022-10-30 NOTE — Progress Notes (Signed)
Subjective:    Patient ID: Alyssa Rich, female    DOB: 1986-07-06, 37 y.o.   MRN: [id number]  Chief Complaint  Patient presents with   Transitions Of Care    Former Okey Dupre pt in the office to transfer PCP; pt is having skin problems, and thought it was allergies, completed allergy test and nothing came back as far as allergies, when using prescription ointment it goes away but when stopping irritation comes back. May need to consider Dermatology referral per pt request. Also c/o upper back soreness at times; and cracked lips on right side.     HPI Patient is in today for a few concerns.   Skin concern -started around her eyes, thought it was moisturizer around her eyes, stopped this, but keeps coming back. No hx of skin issues previously. 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    GERD (gastroesophageal reflux disease)     Past Surgical History:  Procedure Laterality Date   DILATION AND CURETTAGE OF UTERUS      Family History  Problem Relation Age of Onset   Asthma Mother    Allergic rhinitis Mother    Diabetes Mother    Depression Mother     Social History   Tobacco Use   Smoking status: Never    Passive exposure: Never   Smokeless tobacco: Never  Vaping Use   Vaping Use: Never used  Substance Use Topics   Alcohol use: Never   Drug use: Never     No Known Allergies  Review of Systems NEGATIVE UNLESS OTHERWISE INDICATED IN HPI      Objective:     BP (!) 82/54 (BP Location: Left Arm)   Pulse 75   Temp 98.2 F (36.8 C) (Temporal)   Ht 5' (1.524 m)   Wt 105 lb 9.6 oz (47.9 kg)   SpO2 99%   BMI 20.62 kg/m   Wt Readings from Last 3 Encounters:   10/30/22 105 lb 9.6 oz (47.9 kg)  10/16/22 104 lb 9.6 oz (47.4 kg)  09/13/22 105 lb 4.8 oz (47.8 kg)    BP Readings from Last 3 Encounters:  10/30/22 (!) 82/54  10/16/22 92/60  09/13/22 102/64     Physical Exam Vitals and nursing note reviewed.  Constitutional:      Appearance: Normal appearance.  Cardiovascular:     Rate and Rhythm: Normal rate and regular rhythm.  Pulmonary:     Effort: Pulmonary effort is normal.     Breath sounds: Normal breath sounds.  Musculoskeletal:       Arms:     Comments: Normal strength in hands /arms  Skin:    Findings: Rash (faint dry slightly hypopigmented patches upper eyelids, forehead, left check, right posterior hand) present.     Comments: Right angle lip minor dryness and cracking  Neurological:     General: No focal deficit present.     Mental Status: She is alert and oriented to person, place, and time.  Psychiatric:        Mood and Affect: Mood normal.        Behavior: Behavior normal.        Assessment & Plan:  Upper back pain  Allergic contact dermatitis due to other agents -     Ambulatory referral to Dermatology  Angular cheilitis -     Ambulatory referral to Dermatology    Referral placed to [facility]. Use Vaseline ointment for dry / cracking lips; avoid chap stick.  Upper back pain - no injury; likely posture / inflammatory related secondary to holding 2 yo daughter at night to help her fall asleep; Try Voltaren gel 2-3 times per day, ice, posture changes; gentle stretches; consider PT     Return in about 8 months (around 07/01/2023) for CPE, fasting labs .    Teonia Yager M Violett Hobbs, PA-C

## 2022-10-30 NOTE — Patient Instructions (Addendum)
Welcome to [facility]! It was a pleasure meeting you today.  Referral placed to [facility]. Use Vaseline ointment for dry / cracking lips; avoid chap stick. Try Voltaren gel 2-3 times per day, ice, posture changes; gentle stretches; consider PT   PLEASE NOTE:  If you had any LAB tests please let us know if you have not heard back within a few days. You may see your results on MyChart before we have a chance to review them but we will give you a call once they are reviewed by [place]. If we ordered any REFERRALS today, please let us know if you have not heard from their office within the next two weeks.  Let us know through MyChart if you are needing REFILLS, or have your pharmacy send [place] the request. You can also use MyChart to communicate with me or any office staff.  Please try these tips to maintain a healthy lifestyle:  Eat most of your calories during the day when you are active. Eliminate processed foods including packaged sweets (pies, cakes, cookies), reduce intake of potatoes, white bread, white pasta, and white rice. Look for whole grain options, oat flour or almond flour.  Each meal should contain half fruits/vegetables, one quarter protein, and one quarter carbs (no bigger than a computer mouse).  Cut down on sweet beverages. This includes juice, soda, and sweet tea. Also watch fruit intake, though this is a healthier sweet option, it still contains natural sugar! Limit to 3 servings daily.  Drink at least 1 glass of water with each meal and aim for at least 8 glasses (64 ounces) per day.  Exercise at least 150 minutes every week to the best of your ability.    Take Care,  Aristea Posada, PA-C

## 2022-11-06 ENCOUNTER — Encounter: Payer: Self-pay | Admitting: Physician Assistant

## 2022-11-13 NOTE — Addendum Note (Signed)
Addended by: Kellie Simmering, Andreka Stucki on: 11/13/2022 05:02 PM   Modules accepted: Orders

## 2022-11-20 ENCOUNTER — Ambulatory Visit: Payer: BC Managed Care – PPO | Admitting: Dermatology

## 2022-11-20 ENCOUNTER — Encounter: Payer: Self-pay | Admitting: Dermatology

## 2022-11-20 VITALS — BP 99/65

## 2022-11-20 DIAGNOSIS — L209 Atopic dermatitis, unspecified: Secondary | ICD-10-CM | POA: Diagnosis not present

## 2022-11-20 DIAGNOSIS — L309 Dermatitis, unspecified: Secondary | ICD-10-CM

## 2022-11-20 MED ORDER — TRIAMCINOLONE ACETONIDE 0.1 % EX CREA: 1.0000 | TOPICAL_CREAM | Freq: Two times a day (BID) | CUTANEOUS | 0 refills | Status: DC

## 2022-11-20 NOTE — Progress Notes (Signed)
   New Patient Visit   Subjective  Alyssa Rich is a 37 y.o. female who presents for the following: Itchy irritated, flaking skin on her wrists, shoulders, and nipple area x 5 months. She is using Desonide currently because Tacrolimus burned after applying. She says this is intermittent   The following portions of the chart were reviewed this encounter and updated as appropriate: medications, allergies, medical history  Review of Systems:  No other skin or systemic complaints except as noted in HPI or Assessment and Plan.  Objective  Well appearing patient in no apparent distress; mood and affect are within normal limits.  .  A focused examination was performed of the following areas: Arms, chest,    Relevant exam findings are noted in the Assessment and Plan.    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 It is often associated with allergic rhinitis and/or asthma and can require treatment with topical medications, phototherapy, or in severe cases biologic injectable medication (Dupixent; Adbry) or Oral JAK inhibitors.  Treatment Plan: Triamcinolone 0.1 % ointment 2 x daily for 2 weeks then stop.  Discontinue Fragrance cleansers Advised Vani cream products OTC Claritin daily  Recommend gentle skin care.       Return in about 6 weeks (around 01/01/2023).  Jaclynn Guarneri, CMA, am acting as scribe for Langston Reusing, MD.   Documentation: I have reviewed the above documentation for accuracy and completeness, and I agree with the above.  Langston Reusing, MD

## 2022-11-20 NOTE — Patient Instructions (Addendum)
Treatment Plan: Triamcinolone 0.1 % ointment 2 x daily for 2 weeks then stop.  Discontinue Fragrance cleansers Advised Vani cream products OTC Claritin daily    Due to recent changes in healthcare laws, you may see results of your pathology and/or laboratory studies on MyChart before the doctors have had a chance to review them. We understand that in some cases there may be results that are confusing or concerning to you. Please understand that not all results are received at the same time and often the doctors may need to interpret multiple results in order to provide you with the best plan of care or course of treatment. Therefore, we ask that you please give [place] 2 business days to thoroughly review all your results before contacting the office for clarification. Should we see a critical lab result, you will be contacted sooner.   If You Need Anything After Your Visit  If you have any questions or concerns for your doctor, please call our main line at [phone] If no one answers, please leave a voicemail as directed and we will return your call as soon as possible. Messages left after 4 pm will be answered the following business day.   You may also send [place] a message via MyChart. We typically respond to MyChart messages within 1-2 business days.  For prescription refills, please ask your pharmacy to contact our office. Our fax number is [phone].  If you have an urgent issue when the clinic is closed that cannot wait until the next business day, you can page your doctor at the number below.    Please note that while we do our best to be available for urgent issues outside of office hours, we are not available 24/7.   If you have an urgent issue and are unable to reach [place], you may choose to seek medical care at your doctor's office, retail clinic, urgent care center, or emergency room.  If you have a medical emergency, please immediately call 911 or go to the emergency department. In  the event of inclement weather, please call our main line at [phone] for an update on the status of any delays or closures.  Dermatology Medication Tips: Please keep the boxes that topical medications come in in order to help keep track of the instructions about where and how to use these. Pharmacies typically print the medication instructions only on the boxes and not directly on the medication tubes.   If your medication is too expensive, please contact our office at [phone] or send [place] a message through MyChart.   We are unable to tell what your co-pay for medications will be in advance as this is different depending on your insurance coverage. However, we may be able to find a substitute medication at lower cost or fill out paperwork to get insurance to cover a needed medication.   If a prior authorization is required to get your medication covered by your insurance company, please allow [place] 1-2 business days to complete this process.  Drug prices often vary depending on where the prescription is filled and some pharmacies may offer cheaper prices.  The website www.goodrx.com contains coupons for medications through different pharmacies. The prices here do not account for what the cost may be with help from insurance (it may be cheaper with your insurance), but the website can give you the price if you did not use any insurance.  - You can print the associated coupon and take it with your prescription to  the pharmacy.  - You may also stop by our office during regular business hours and pick up a GoodRx coupon card.  - If you need your prescription sent electronically to a different pharmacy, notify our office through [facility] or by phone at [phone]

## 2022-11-22 DIAGNOSIS — Z01419 Encounter for gynecological examination (general) (routine) without abnormal findings: Secondary | ICD-10-CM | POA: Diagnosis not present

## 2022-11-22 DIAGNOSIS — Z6820 Body mass index (BMI) 20.0-20.9, adult: Secondary | ICD-10-CM | POA: Diagnosis not present

## 2022-11-22 DIAGNOSIS — Z124 Encounter for screening for malignant neoplasm of cervix: Secondary | ICD-10-CM | POA: Diagnosis not present

## 2022-12-07 ENCOUNTER — Encounter: Payer: Self-pay | Admitting: Dermatology

## 2023-01-02 ENCOUNTER — Ambulatory Visit (INDEPENDENT_AMBULATORY_CARE_PROVIDER_SITE_OTHER): Payer: BC Managed Care – PPO | Admitting: Dermatology

## 2023-01-02 DIAGNOSIS — L209 Atopic dermatitis, unspecified: Secondary | ICD-10-CM

## 2023-01-02 DIAGNOSIS — L309 Dermatitis, unspecified: Secondary | ICD-10-CM

## 2023-01-02 DIAGNOSIS — L814 Other melanin hyperpigmentation: Secondary | ICD-10-CM | POA: Diagnosis not present

## 2023-01-02 NOTE — Progress Notes (Signed)
   Follow-Up Visit   Subjective  Alyssa Rich is a 37 y.o. female who presents for the following: Atopic Dermatitis (Eczema)  Patient present today for follow up visit for Eczema. Patient was last evaluated on 11/20/22. Patient reports not at goal. Patient reports no medication changes.   The following portions of the chart were reviewed this encounter and updated as appropriate: medications, allergies, medical history  Review of Systems:  No other skin or systemic complaints except as noted in HPI or Assessment and Plan.  Objective  Well appearing patient in no apparent distress; mood and affect are within normal limits.  A focused examination was performed of the following areas: Face, neck, chest  Relevant exam findings are noted in the Assessment and Plan.   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 It is often associated with allergic rhinitis and/or asthma and can require treatment with topical medications, phototherapy, or in severe cases biologic injectable medication (Dupixent; Adbry) or Oral JAK inhibitors.  Treatment Plan: - Educated about Dupixent Injections - We will continue alternating Tacrolimus and Triamcinolone when you have flares  Letingenies  Exam: Small brown   Treatment plan: -Apply VaniCream   Recommend gentle skin care.     No follow-ups on file.  ***  Documentation: I have reviewed the above documentation for accuracy and completeness, and I agree with the above.  Langston Reusing, DO

## 2023-01-02 NOTE — Patient Instructions (Addendum)
Due to recent changes in healthcare laws, you may see results of your pathology and/or laboratory studies on MyChart before the doctors have had a chance to review them. We understand that in some cases there may be results that are confusing or concerning to you. Please understand that not all results are received at the same time and often the doctors may need to interpret multiple results in order to provide you with the best plan of care or course of treatment. Therefore, we ask that you please give us 2 business days to thoroughly review all your results before contacting the office for clarification. Should we see a critical lab result, you will be contacted sooner.   If You Need Anything After Your Visit  If you have any questions or concerns for your doctor, please call our main line at [phone] If no one answers, please leave a voicemail as directed and we will return your call as soon as possible. Messages left after 4 pm will be answered the following business day.   You may also send us a message via MyChart. We typically respond to MyChart messages within 1-2 business days.  For prescription refills, please ask your pharmacy to contact our office. Our fax number is [phone].  If you have an urgent issue when the clinic is closed that cannot wait until the next business day, you can page your doctor at the number below.    Please note that while we do our best to be available for urgent issues outside of office hours, we are not available 24/7.   If you have an urgent issue and are unable to reach us, you may choose to seek medical care at your doctor's office, retail clinic, urgent care center, or emergency room.  If you have a medical emergency, please immediately call 911 or go to the emergency department. In the event of inclement weather, please call our main line at [phone] for an update on the status of any delays or closures.  Dermatology Medication Tips: Please  keep the boxes that topical medications come in in order to help keep track of the instructions about where and how to use these. Pharmacies typically print the medication instructions only on the boxes and not directly on the medication tubes.   If your medication is too expensive, please contact our office at [phone] or send us a message through MyChart.   We are unable to tell what your co-pay for medications will be in advance as this is different depending on your insurance coverage. However, we may be able to find a substitute medication at lower cost or fill out paperwork to get insurance to cover a needed medication.   If a prior authorization is required to get your medication covered by your insurance company, please allow us 1-2 business days to complete this process.  Drug prices often vary depending on where the prescription is filled and some pharmacies may offer cheaper prices.  The website www.goodrx.com contains coupons for medications through different pharmacies. The prices here do not account for what the cost may be with help from insurance (it may be cheaper with your insurance), but the website can give you the price if you did not use any insurance.  - You can print the associated coupon and take it with your prescription to the pharmacy.  - You may also stop by our office during regular business hours and pick up a GoodRx coupon card.  - If you need your   prescription sent electronically to a different pharmacy, notify our office through [facility] MyChart or by phone at [phone]     

## 2023-01-08 ENCOUNTER — Telehealth: Payer: Self-pay

## 2023-01-08 ENCOUNTER — Encounter: Payer: Self-pay | Admitting: Dermatology

## 2023-01-08 NOTE — Telephone Encounter (Signed)
I spoke to her and discussed trying OTC Tylenol or Advil for muscle cramping. And to gently stretch the area. I asked her to let us know if symptoms do not improve.

## 2023-04-05 ENCOUNTER — Telehealth: Payer: Self-pay | Admitting: Family Medicine

## 2023-04-05 NOTE — Telephone Encounter (Signed)
Manik (spouse DPR NOT ON FILE) called stating that pt was looking to transfer her care from Alyssa Allwardt to Dr. Zola Button due to them moving from [facility] to [facility] and wanting a closer office.   After speaking with Marylene Land on this matter, was advised to reach out to pt to verify that this change was wanted.  Attempted to reach out to pt to verify this change. Pt's VM was not set up and unable to LVM.

## 2023-04-05 NOTE — Telephone Encounter (Signed)
Alyssa Rich called & verified that she is requesting a TOC. Please advise if pt can be established at our office.

## 2023-04-23 NOTE — Telephone Encounter (Signed)
Pt was called and scheduled for TOC appt

## 2023-05-03 ENCOUNTER — Encounter: Payer: Self-pay | Admitting: Dermatology

## 2023-05-03 ENCOUNTER — Ambulatory Visit: Payer: BC Managed Care – PPO | Admitting: Dermatology

## 2023-05-03 DIAGNOSIS — L309 Dermatitis, unspecified: Secondary | ICD-10-CM

## 2023-05-03 DIAGNOSIS — L209 Atopic dermatitis, unspecified: Secondary | ICD-10-CM | POA: Diagnosis not present

## 2023-05-03 NOTE — Progress Notes (Signed)
   Follow-Up Visit   Subjective  Alyssa Rich is a 37 y.o. female who presents for the following: Eczema follow up - She is still having flares of her face and nipples if she stops the medications. She is alternating Tacrolimus with Triamcinolone every 2 weeks.    The following portions of the chart were reviewed this encounter and updated as appropriate: medications, allergies, medical history  Review of Systems:  No other skin or systemic complaints except as noted in HPI or Assessment and Plan.  Objective  Well appearing patient in no apparent distress; mood and affect are within normal limits.   A focused examination was performed of the following areas:   Relevant exam findings are noted in the Assessment and Plan.    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 Condition is symptomatic/ bothersome to patient. Not currently at goal.    Treatment Plan: Discussed Dupixent. She is interested in starting injections.  Continue alternating Triamcinolone and Tacrolimus every 2 weeks until we get Dupixent approved.  Recommend gentle skin care.   Plan: Dupixent Initiation Indications:  Patient isn't a candidate for systemic therapy with methotrexate or cyclosporine. Patient has been unresponsive to aggressive topical therapy.  Failed Treatments: Topical Steroids and Topical Protopic  Treatment Protocol: 600 mg [place] day 0 then 300 mg [place] every other week  Specific Contraindications Cyclosporine is contraindicated because the patient will not be able to complete the necessary follow-up labs. Methotrexate is contraindicated because the patient will not be able to complete the necessary follow-up labs. Phototherapy is contraindicated because the patient lives too far from the treatment location.   Dupixent Counseling: I discussed with the  patient the risks of dupilumab including but not limited to eye infection and irritation, cold sores, injection site reactions, worsening of asthma, allergic reactions and increased risk of parasitic infection. Live vaccines should be avoided while taking dupilumab. Dupilumab will also interact with certain medications such as warfarin and cyclosporine. The patient understands that monitoring is required and they must alert [place] or the primary physician if symptoms of infection or other concerning signs are noted.  Dupixent Monitoring: There is no laboratory monitoring requirement with Dupixent.   Return for Will schedule once approved for Dupixent.  I, Joanie Coddington, CMA, am acting as scribe for [employer], DO .   Documentation: I have reviewed the above documentation for accuracy and completeness, and I agree with the above.  Langston Reusing, DO

## 2023-05-03 NOTE — Patient Instructions (Signed)
Hello Liyla,  Thank you for visiting my office today. Your dedication to managing your eczema and exploring new treatment options is greatly appreciated. Here is a summary of our discussion and the outlined steps for your treatment plan:  - Eczema Management: Continue with Triamcinolone and Tacrolimus as previously directed for immediate relief.  - Dupixent Treatment:   - Approval Process: An application will be submitted for approval, which may take up to three weeks.   - Administration: Initial injections will be administered in the office, followed by self-administered doses every two weeks using a pre-filled pen.  - Moisturizing Regimen:   - Current: Continue using Vanicream.   - Samples Provided: Eucerin Advanced Repair and La Roche-Posay Lipikar Triple Moisture for trial.  Please ensure to sign the necessary forms for the Dupixent application today. At this time, no refills are needed for your creams. We will reach out to you as soon as the approval for Dupixent is confirmed to schedule your injection training.  Thank you once again for your proactive approach to your dermatological health. We look forward to seeing you soon.  Warm regards,  Dr. Langston Reusing,  Dermatology   Important Information  Due to recent changes in healthcare laws, you may see results of your pathology and/or laboratory studies on MyChart before the doctors have had a chance to review them. We understand that in some cases there may be results that are confusing or concerning to you. Please understand that not all results are received at the same time and often the doctors may need to interpret multiple results in order to provide you with the best plan of care or course of treatment. Therefore, we ask that you please give [place] 2 business days to thoroughly review all your results before contacting the office for clarification. Should we see a critical lab result, you will be contacted sooner.   If You Need  Anything After Your Visit  If you have any questions or concerns for your doctor, please call our main line at [phone] If no one answers, please leave a voicemail as directed and we will return your call as soon as possible. Messages left after 4 pm will be answered the following business day.   You may also send [place] a message via MyChart. We typically respond to MyChart messages within 1-2 business days.  For prescription refills, please ask your pharmacy to contact our office. Our fax number is [phone].  If you have an urgent issue when the clinic is closed that cannot wait until the next business day, you can page your doctor at the number below.    Please note that while we do our best to be available for urgent issues outside of office hours, we are not available 24/7.   If you have an urgent issue and are unable to reach [place], you may choose to seek medical care at your doctor's office, retail clinic, urgent care center, or emergency room.  If you have a medical emergency, please immediately call 911 or go to the emergency department. In the event of inclement weather, please call our main line at [phone] for an update on the status of any delays or closures.  Dermatology Medication Tips: Please keep the boxes that topical medications come in in order to help keep track of the instructions about where and how to use these. Pharmacies typically print the medication instructions only on the boxes and not directly on the medication tubes.   If your medication  is too expensive, please contact our office at [phone] or send [place] a message through MyChart.   We are unable to tell what your co-pay for medications will be in advance as this is different depending on your insurance coverage. However, we may be able to find a substitute medication at lower cost or fill out paperwork to get insurance to cover a needed medication.   If a prior authorization is required to get your  medication covered by your insurance company, please allow [place] 1-2 business days to complete this process.  Drug prices often vary depending on where the prescription is filled and some pharmacies may offer cheaper prices.  The website www.goodrx.com contains coupons for medications through different pharmacies. The prices here do not account for what the cost may be with help from insurance (it may be cheaper with your insurance), but the website can give you the price if you did not use any insurance.  - You can print the associated coupon and take it with your prescription to the pharmacy.  - You may also stop by our office during regular business hours and pick up a GoodRx coupon card.  - If you need your prescription sent electronically to a different pharmacy, notify our office through [facility] or by phone at [phone]

## 2023-05-30 ENCOUNTER — Other Ambulatory Visit: Payer: Self-pay

## 2023-05-30 DIAGNOSIS — L309 Dermatitis, unspecified: Secondary | ICD-10-CM

## 2023-05-30 MED ORDER — DUPIXENT 300 MG/2ML ~~LOC~~ SOAJ: 300.0000 mg | SUBCUTANEOUS | 6 refills | Status: DC

## 2023-05-30 MED ORDER — DUPIXENT 300 MG/2ML ~~LOC~~ SOAJ: 600.0000 mg | Freq: Once | SUBCUTANEOUS | 0 refills | Status: AC

## 2023-06-03 ENCOUNTER — Encounter: Payer: Self-pay | Admitting: Dermatology

## 2023-06-04 NOTE — Telephone Encounter (Signed)
Parks Ranger, any updates on pt's dupixent?

## 2023-07-19 ENCOUNTER — Ambulatory Visit (HOSPITAL_BASED_OUTPATIENT_CLINIC_OR_DEPARTMENT_OTHER)
Admission: RE | Admit: 2023-07-19 | Discharge: 2023-07-19 | Disposition: A | Discharge status: Home / Self Care | Payer: BC Managed Care – PPO | Source: Ambulatory Visit | Attending: Family Medicine | Admitting: Family Medicine

## 2023-07-19 ENCOUNTER — Ambulatory Visit: Payer: BC Managed Care – PPO | Admitting: Family Medicine

## 2023-07-19 ENCOUNTER — Encounter: Payer: Self-pay | Admitting: Family Medicine

## 2023-07-19 VITALS — BP 98/68 | HR 81 | Temp 98.4°F | Resp 16 | Ht 60.0 in | Wt 106.8 lb

## 2023-07-19 DIAGNOSIS — Z23 Encounter for immunization: Secondary | ICD-10-CM | POA: Diagnosis not present

## 2023-07-19 DIAGNOSIS — G8929 Other chronic pain: Secondary | ICD-10-CM

## 2023-07-19 DIAGNOSIS — M25562 Pain in left knee: Secondary | ICD-10-CM | POA: Insufficient documentation

## 2023-07-19 DIAGNOSIS — R7303 Prediabetes: Secondary | ICD-10-CM

## 2023-07-19 DIAGNOSIS — M25561 Pain in right knee: Secondary | ICD-10-CM | POA: Insufficient documentation

## 2023-07-19 DIAGNOSIS — E785 Hyperlipidemia, unspecified: Secondary | ICD-10-CM | POA: Diagnosis not present

## 2023-07-19 NOTE — Progress Notes (Signed)
 Established Patient Office Visit  Subjective   Patient ID: Alyssa Rich, female    DOB: 02-05-1986  Age: 38 y.o. MRN: [id number]  Chief Complaint  Patient presents with   New Patient (Initial Visit)    HPI Discussed the use of AI scribe software for clinical note transcription with the patient, who gave verbal consent to proceed.  History of Present Illness   The patient, with a history of dermatitis, presents with complaints of vertigo and joint pain. She has been prescribed Dupixent  for dermatitis, but has not yet started the medication. She reports experiencing vertigo, but the onset and frequency are not specified.  The patient also reports pain in the left knee that has been present for approximately two to three months. The pain is not constant, but is particularly noticeable when bending the knee or climbing stairs. There is no history of injury to the knee, and the patient has ceased regular exercise.  Additionally, the patient reports discomfort in the shoulder joint when lifting objects for extended periods, such as during cooking. The discomfort is localized to one spot and is not constant, but is triggered by repetitive work.  The patient also reports a recent onset of a tremor in one finger, which has been present for approximately one week. The tremor is involuntary and the patient has recorded a video of the tremor for reference.  The patient has a family history of asthma, diabetes, allergies, depression, and high cholesterol. Her most recent blood test indicated high cholesterol and prediabetes. The patient is currently not exercising regularly and has a child who is almost three years old. She works as an geographical information systems officer.  The patient has not had a flu shot this year but has expressed a desire to receive one. She has not seen an eye doctor recently but has plans to visit a dentist. The patient denies any alcohol, smoking, or drug use, with the exception of  tea.  The patient's last visit to an OB GYN was the previous year, with no reported issues. She has not been pregnant recently and is not currently trying to conceive. The patient lives with her husband and does not have any pets.      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    GERD (gastroesophageal reflux disease)    Past Surgical History:  Procedure Laterality Date   DILATION AND CURETTAGE OF UTERUS     Social History   Tobacco Use   Smoking status: Never    Passive exposure: Never   Smokeless tobacco: Never  Vaping Use   Vaping status: Never Used  Substance Use Topics   Alcohol use: Never   Drug use: Never   Social History   Socioeconomic History   Marital status: Married    Spouse name: Not on file   Number of children: 1   Years of education: Not on file   Highest education level: Not on file  Occupational History   Not on file  Tobacco Use   Smoking status: Never    Passive exposure: Never   Smokeless tobacco: Never  Vaping Use   Vaping status: Never Used  Substance and Sexual Activity   Alcohol use: Never   Drug use: Never   Sexual  activity: Yes    Partners: Male    Birth control/protection: Pill  Other Topics Concern   Not on file  Social History Narrative   Exercise-- no    Lives with husband and daughter    In a house    No pets   Social Drivers of Corporate Investment Banker Strain: Not on file  Food Insecurity: Not on file  Transportation Needs: Not on file  Physical Activity: Not on file  Stress: Not on file  Social Connections: Not on file  Intimate Partner Violence: Not on file   Family Status  Relation Name Status   Mother  Alive   Father  Alive   Brother  Alive   MGM  Deceased   MGF   Deceased   PGM  Deceased   PGF  Deceased   Mat Uncle  Alive  No partnership data on file   Family History  Problem Relation Age of Onset   Hyperlipidemia Mother    Asthma Mother    Allergic rhinitis Mother    Diabetes Mother    Depression Mother    Hyperlipidemia Father    Hyperlipidemia Brother    No Known Allergies    Review of Systems  Constitutional:  Negative for fever and malaise/fatigue.  HENT:  Negative for congestion.   Eyes:  Negative for blurred vision.  Respiratory:  Negative for cough and shortness of breath.   Cardiovascular:  Negative for chest pain, palpitations and leg swelling.  Gastrointestinal:  Negative for abdominal pain, blood in stool, nausea and vomiting.  Genitourinary:  Negative for dysuria and frequency.  Musculoskeletal:  Positive for joint pain. Negative for back pain and falls.  Skin:  Negative for rash.  Neurological:  Negative for dizziness, loss of consciousness and headaches.  Endo/Heme/Allergies:  Negative for environmental allergies.  Psychiatric/Behavioral:  Negative for depression. The patient is not nervous/anxious.       Objective:     BP 98/68 (BP Location: Left Arm, Patient Position: Sitting)   Pulse 81   Temp 98.4 F (36.9 C) (Oral)   Resp 16   Ht 5' (1.524 m)   Wt 106 lb 12.8 oz (48.4 kg)   LMP 07/01/2023   SpO2 99%   BMI 20.86 kg/m  BP Readings from Last 3 Encounters:  07/19/23 98/68  11/20/22 99/65  10/30/22 (!) 82/54   Wt Readings from Last 3 Encounters:  07/19/23 106 lb 12.8 oz (48.4 kg)  10/30/22 105 lb 9.6 oz (47.9 kg)  10/16/22 104 lb 9.6 oz (47.4 kg)   SpO2 Readings from Last 3 Encounters:  07/19/23 99%  10/30/22 99%  10/16/22 100%      Physical Exam Vitals and nursing note reviewed.  Constitutional:      General: She is not in acute distress.    Appearance: Normal appearance. She is well-developed.  HENT:     Head: Normocephalic and atraumatic.  Eyes:     General: No scleral icterus.        Right eye: No discharge.        Left eye: No discharge.  Cardiovascular:     Rate and Rhythm: Normal rate and regular rhythm.     Heart sounds: No murmur heard. Pulmonary:     Effort: Pulmonary effort is normal. No respiratory distress.     Breath sounds: Normal breath sounds.  Musculoskeletal:        General: Tenderness present. Normal range of motion.     Right shoulder: Tenderness  and bony tenderness present. No crepitus. Normal strength. Normal pulse.       Arms:     Cervical back: Normal range of motion and neck supple.     Right lower leg: No edema.     Left lower leg: No edema.  Skin:    General: Skin is warm and dry.  Neurological:     Mental Status: She is alert and oriented to person, place, and time.  Psychiatric:        Mood and Affect: Mood normal.        Behavior: Behavior normal.        Thought Content: Thought content normal.        Judgment: Judgment normal.      No results found for any visits on 07/19/23.  Last CBC Lab Results  Component Value Date   WBC 7.8 06/22/2022   HGB 12.3 06/22/2022   HCT 37.5 06/22/2022   MCV 80.2 06/22/2022   MCH 27.2 08/31/2020   RDW 13.4 06/22/2022   PLT 167.0 06/22/2022   Last metabolic panel Lab Results  Component Value Date   GLUCOSE 112 (H) 06/22/2022   NA 138 06/22/2022   K 3.6 06/22/2022   CL 105 06/22/2022   CO2 24 06/22/2022   BUN 10 06/22/2022   CREATININE 0.73 06/22/2022   GFR 106.81 06/22/2022   CALCIUM 9.0 06/22/2022   PROT 7.7 06/22/2022   ALBUMIN 4.4 06/22/2022   BILITOT 0.5 06/22/2022   ALKPHOS 49 06/22/2022   AST 15 06/22/2022   ALT 27 06/22/2022   Last lipids Lab Results  Component Value Date   CHOL 226 (H) 06/22/2022   HDL 56.30 06/22/2022   LDLCALC 156 (H) 06/22/2022   TRIG 67.0 06/22/2022   CHOLHDL 4 06/22/2022   Last hemoglobin A1c No results found for: HGBA1C Last thyroid  functions Lab Results  Component Value Date   TSH 0.36 06/22/2022   Last vitamin D  Lab Results   Component Value Date   VD25OH 22.21 (L) 06/22/2022   Last vitamin B12 and Folate Lab Results  Component Value Date   VITAMINB12 323 06/22/2022      The ASCVD Risk score (Arnett DK, et al., 2019) failed to calculate for the following reasons:   The 2019 ASCVD risk score is only valid for ages 57 to 42    Assessment & Plan:   Problem List Items Addressed This Visit   None Visit Diagnoses       Hyperlipidemia, unspecified hyperlipidemia type    -  Primary   Relevant Orders   CBC with Differential/Platelet   Comprehensive metabolic panel   Lipid panel   TSH   Vitamin B12   VITAMIN D  25 Hydroxy (Vit-D Deficiency, Fractures)     Prediabetes       Relevant Orders   Comprehensive metabolic panel   Hemoglobin A1c     Need for influenza vaccination       Relevant Orders   Flu vaccine trivalent PF, 6mos and older(Flulaval,Afluria,Fluarix,Fluzone) (Completed)     Chronic pain of right knee       Relevant Orders   DG Knee Complete 4 Views Right     Chronic pain of left knee       Relevant Orders   DG Knee Complete 4 Views Left     Assessment and Plan    Knee Pain   Intermittent left knee pain for 2-3 months, exacerbated by bending and stairs, suggests bursitis or tendonitis. We will order  an x-ray of the left knee and recommend Tylenol  Arthritis every 6-8 hours as needed, along with icing the knee at night. A referral to sports medicine will follow if pain persists or worsens for further evaluation and possible injection.  Shoulder Pain   She experiences intermittent shoulder pain, worsened by repetitive activities, indicating possible bursitis or tendonitis. Tylenol  Arthritis is recommended every 6-8 hours as needed, and icing the shoulder at night. If pain continues or worsens, a referral to sports medicine for further evaluation and possible injection is planned.  Eczema   Her chronic dermatitis is managed with Dupixent , which she has not yet received. We will ensure the  Dupixent  prescription is filled and administered.  Vertigo   She reports intermittent episodes of vertigo with no identified triggers. Symptoms will be monitored.  Tremor   A new onset finger tremor for one week, possibly stress-related and with no family history, will be monitored.  Prediabetes   With a diagnosis of prediabetes and the last blood test conducted last summer, the importance of monitoring blood glucose levels was discussed. Blood work will be ordered to check the current status.  Hyperlipidemia   Given her hyperlipidemia and elevated levels indicated by the last blood test, the importance of monitoring lipid levels was discussed. Blood work will be ordered to check the current status.  General Health Maintenance   Routine health maintenance was discussed, including her desire for a flu shot, which will be administered. Regular visits to OB GYN and dentist, along with regular eye exams, are encouraged.  Follow-up   Blood work and an x-ray of the left knee will be performed today. We will follow up on x-ray results in a few days. She should contact the office if pain worsens or if there is no update on x-ray results.        Return in about 1 year (around 07/18/2024), or if symptoms worsen or fail to improve, for annual exam, fasting.    Ankur Snowdon R Lowne Chase, DO

## 2023-07-19 NOTE — Patient Instructions (Signed)

## 2023-07-20 LAB — LIPID PANEL
Cholesterol: 258 mg/dL — ABNORMAL HIGH (ref 0–200)
HDL: 46.1 mg/dL (ref >39.00)
LDL Cholesterol: 177 mg/dL — ABNORMAL HIGH (ref 0–99)
NonHDL: 212.06
Total CHOL/HDL Ratio: 6
Triglycerides: 177 mg/dL — ABNORMAL HIGH (ref 0.0–149.0)
VLDL: 35.4 mg/dL (ref 0.0–40.0)

## 2023-07-20 LAB — COMPREHENSIVE METABOLIC PANEL
ALT: 16 U/L (ref 0–35)
AST: 17 U/L (ref 0–37)
Albumin: 4.8 g/dL (ref 3.5–5.2)
Alkaline Phosphatase: 66 U/L (ref 39–117)
BUN: 11 mg/dL (ref 6–23)
CO2: 25 meq/L (ref 19–32)
Calcium: 9.8 mg/dL (ref 8.4–10.5)
Chloride: 101 meq/L (ref 96–112)
Creatinine, Ser: 0.73 mg/dL (ref 0.40–1.20)
GFR: 106.01 mL/min (ref >60.00)
Glucose, Bld: 82 mg/dL (ref 70–99)
Potassium: 4.2 meq/L (ref 3.5–5.1)
Sodium: 136 meq/L (ref 135–145)
Total Bilirubin: 0.5 mg/dL (ref 0.2–1.2)
Total Protein: 7.8 g/dL (ref 6.0–8.3)

## 2023-07-20 LAB — CBC WITH DIFFERENTIAL/PLATELET
Basophils Absolute: 0 10*3/uL (ref 0.0–0.1)
Basophils Relative: 0.5 % (ref 0.0–3.0)
Eosinophils Absolute: 0.1 10*3/uL (ref 0.0–0.7)
Eosinophils Relative: 1.2 % (ref 0.0–5.0)
HCT: 38.3 % (ref 36.0–46.0)
Hemoglobin: 12.4 g/dL (ref 12.0–15.0)
Lymphocytes Relative: 14.4 % (ref 12.0–46.0)
Lymphs Abs: 1.1 10*3/uL (ref 0.7–4.0)
MCHC: 32.5 g/dL (ref 30.0–36.0)
MCV: 80.3 fL (ref 78.0–100.0)
Monocytes Absolute: 0.4 10*3/uL (ref 0.1–1.0)
Monocytes Relative: 5.8 % (ref 3.0–12.0)
Neutro Abs: 5.9 10*3/uL (ref 1.4–7.7)
Neutrophils Relative %: 78.1 % — ABNORMAL HIGH (ref 43.0–77.0)
Platelets: 160 10*3/uL (ref 150.0–400.0)
RBC: 4.77 Mil/uL (ref 3.87–5.11)
RDW: 13.8 % (ref 11.5–15.5)
WBC: 7.6 10*3/uL (ref 4.0–10.5)

## 2023-07-20 LAB — VITAMIN B12: Vitamin B-12: 251 pg/mL (ref 211–911)

## 2023-07-20 LAB — TSH: TSH: 0.91 u[IU]/mL (ref 0.35–5.50)

## 2023-07-20 LAB — HEMOGLOBIN A1C: Hgb A1c MFr Bld: 5.2 % (ref 4.6–6.5)

## 2023-07-20 LAB — VITAMIN D 25 HYDROXY (VIT D DEFICIENCY, FRACTURES): VITD: 13.88 ng/mL — ABNORMAL LOW (ref 30.00–100.00)

## 2023-07-21 ENCOUNTER — Encounter: Payer: Self-pay | Admitting: Family Medicine

## 2023-07-24 ENCOUNTER — Encounter: Payer: Self-pay | Admitting: Family Medicine

## 2023-07-25 ENCOUNTER — Ambulatory Visit: Payer: BC Managed Care – PPO | Admitting: Family Medicine

## 2023-07-25 ENCOUNTER — Encounter: Payer: Self-pay | Admitting: Family Medicine

## 2023-07-25 VITALS — BP 100/70 | HR 103 | Temp 98.0°F | Resp 16 | Ht 61.0 in | Wt 107.6 lb

## 2023-07-25 DIAGNOSIS — J0100 Acute maxillary sinusitis, unspecified: Secondary | ICD-10-CM

## 2023-07-25 LAB — POCT INFLUENZA A/B
Influenza A, POC: NEGATIVE
Influenza B, POC: NEGATIVE

## 2023-07-25 LAB — POC COVID19 BINAXNOW: SARS Coronavirus 2 Ag: NEGATIVE

## 2023-07-25 MED ORDER — PREDNISONE 20 MG PO TABS
40.0000 mg | ORAL_TABLET | Freq: Every day | ORAL | 0 refills | Status: AC
Start: 2023-07-25 — End: 2023-07-30

## 2023-07-25 NOTE — Progress Notes (Signed)
 Chief Complaint  Patient presents with   Sinus Problem    Alyssa Rich here for URI complaints.  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    GERD (gastroesophageal reflux disease)     Objective BP 100/70   Pulse (!) 103   Temp 98 F (36.7 C) (Oral)   Resp 16   Ht 5' 1 (1.549 m)   Wt 107 lb 9.6 oz (48.8 kg)   LMP 07/01/2023   SpO2 98%   BMI 20.33 kg/m  General: Awake, alert, appears stated age HEENT: AT, [place], ears patent b/l and TM's neg, nares patent w/o discharge, pharynx pink and without exudates, MMM, ttp over R max sinus Neck: No masses or asymmetry Heart: RRR Lungs: CTAB, no accessory muscle use Psych: Age appropriate judgment and insight, normal mood and affect  Acute maxillary sinusitis, recurrence not specified - Plan: predniSONE  (DELTASONE ) 20 MG tablet  W hx of allergy, will rx 5 d pred burst 40 mg/d. Send message in 2 d if no better. Continue to push fluids, practice good hand hygiene, cover mouth when coughing. F/u prn. If starting to experience fevers, shaking, or shortness of breath, seek immediate care. Pt voiced understanding and agreement to the plan.  Mabel Mt Scalp [place], DO 07/25/23 9:28 AM

## 2023-07-25 NOTE — Patient Instructions (Addendum)
 Continue to push fluids, practice good hand hygiene, and cover your mouth if you cough.  If you start having fevers, shaking or shortness of breath, seek immediate care.  OK to take Tylenol  1000 mg (2 extra strength tabs) or 975 mg (3 regular strength tabs) every 6 hours as needed.  Send me a message mid morning on Friday if no better.   Let us  know if you need anything.

## 2023-08-08 ENCOUNTER — Encounter: Payer: Self-pay | Admitting: Dermatology

## 2023-08-13 ENCOUNTER — Encounter: Payer: Self-pay | Admitting: Dermatology

## 2023-08-13 ENCOUNTER — Ambulatory Visit (INDEPENDENT_AMBULATORY_CARE_PROVIDER_SITE_OTHER): Payer: BC Managed Care – PPO | Admitting: Dermatology

## 2023-08-13 VITALS — BP 96/62

## 2023-08-13 DIAGNOSIS — L209 Atopic dermatitis, unspecified: Secondary | ICD-10-CM | POA: Diagnosis not present

## 2023-08-13 DIAGNOSIS — L2089 Other atopic dermatitis: Secondary | ICD-10-CM

## 2023-08-13 MED ORDER — DUPILUMAB 300 MG/2ML ~~LOC~~ SOAJ
600.0000 mg | Freq: Once | SUBCUTANEOUS | Status: AC
  Administered 2023-08-13: 600 mg via SUBCUTANEOUS

## 2023-08-13 NOTE — Progress Notes (Unsigned)
   Follow-Up Visit   Subjective  Alyssa Rich is a 38 y.o. female who presents for the following: Eczema follow up - she is here for injection training for Dupixent.   The following portions of the chart were reviewed this encounter and updated as appropriate: medications, allergies, medical history  Review of Systems:  No other skin or systemic complaints except as noted in HPI or Assessment and Plan.  Objective  Well appearing patient in no apparent distress; mood and affect are within normal limits.  A focused examination was performed of the following areas:   Relevant exam findings are noted in the Assessment and Plan.    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 Condition is symptomatic/ bothersome to patient. Not currently at goal.    Treatment Plan: Dupixent x 2 injected today. Patient trained to do injections today. [facility] [id number]  Lot# [id number]  Exp 03/16/2025  Patient stayed in office for 15 minutes after injections to watch for side effects.  Advised patient to continue Dupixent injections every other week.  Recommend gentle skin care.     OTHER ATOPIC DERMATITIS   Related Medications Dupilumab SOAJ 600 mg   Return in about 3 months (around 11/11/2023) for Atopic Dermatitis.  I, Joanie Coddington, CMA, am acting as scribe for [employer], DO .   Documentation: I have reviewed the above documentation for accuracy and completeness, and I agree with the above.  Langston Reusing, DO

## 2023-08-13 NOTE — Patient Instructions (Addendum)
Alyssa Rich,  Thank you for visiting [place] today. We appreciate your commitment to managing your eczema and are pleased to support you in starting your new treatment. Here is a summary of the key instructions from today's appointment:  Dupixent Injections: You received your loading dose of Dupixent, with two injections of 300 mg each. I assisted with the first injection, and you successfully self-injected the second one in your thigh.   Frequency: Continue with one Dupixent injection every two weeks. The next dose is due on your own.   Follow-Up: We will review your progress in 3 months.  Topical Tacrolimus: You may use topical tacrolimus for any eczema flares that occur while the Dupixent is taking full effect.  Skin Care Routine: Maintain gentle skin care routines and use plenty of moisturizers to protect your skin barrier.  Avoid Triggers: Avoid known allergens or triggers that exacerbate your eczema.  Please remember to monitor for any adverse reactions and contact us if you have any concerns. We look forward to seeing you again in three months for a follow-up.  Best regards,  Dr. Lang[facility]     Important Information  Due to recent changes in healthcare laws, you may see results of your pathology and/or laboratory studies on MyChart before the doctors have had a chance to review them. We understand that in some cases there may be results that are confusing or concerning to you. Please understand that not all results are received at the same time and often the doctors may need to interpret multiple results in order to provide you with the best plan of care or course of treatment. Therefore, we ask that you please give [place] 2 business days to thoroughly review all your results before contacting the office for clarification. Should we see a critical lab result, you will be contacted sooner.   If You Need Anything After Your Visit  If you have any questions or  concerns for your doctor, please call our main line at [phone] If no one answers, please leave a voicemail as directed and we will return your call as soon as possible. Messages left after 4 pm will be answered the following business day.   You may also send [place] a message via MyChart. We typically respond to MyChart messages within 1-2 business days.  For prescription refills, please ask your pharmacy to contact our office. Our fax number is [phone].  If you have an urgent issue when the clinic is closed that cannot wait until the next business day, you can page your doctor at the number below.    Please note that while we do our best to be available for urgent issues outside of office hours, we are not available 24/7.   If you have an urgent issue and are unable to reach [place], you may choose to seek medical care at your doctor's office, retail clinic, urgent care center, or emergency room.  If you have a medical emergency, please immediately call 911 or go to the emergency department. In the event of inclement weather, please call our main line at [phone] for an update on the status of any delays or closures.  Dermatology Medication Tips: Please keep the boxes that topical medications come in in order to help keep track of the instructions about where and how to use these. Pharmacies typically print the medication instructions only on the boxes and not directly on the medication tubes.   If your medication is too expensive, please  contact our office at [phone] or send [place] a message through MyChart.   We are unable to tell what your co-pay for medications will be in advance as this is different depending on your insurance coverage. However, we may be able to find a substitute medication at lower cost or fill out paperwork to get insurance to cover a needed medication.   If a prior authorization is required to get your medication covered by your insurance company, please allow [place]  1-2 business days to complete this process.  Drug prices often vary depending on where the prescription is filled and some pharmacies may offer cheaper prices.  The website www.goodrx.com contains coupons for medications through different pharmacies. The prices here do not account for what the cost may be with help from insurance (it may be cheaper with your insurance), but the website can give you the price if you did not use any insurance.  - You can print the associated coupon and take it with your prescription to the pharmacy.  - You may also stop by our office during regular business hours and pick up a GoodRx coupon card.  - If you need your prescription sent electronically to a different pharmacy, notify our office through [facility] or by phone at [phone]

## 2023-08-23 ENCOUNTER — Encounter: Payer: Self-pay | Admitting: Dermatology

## 2023-08-23 NOTE — Telephone Encounter (Signed)
 I'm not sure what PLA she's referring to.  Perhaps calling Senderra and then the patient will help trouble shoot the issure :)

## 2023-11-14 NOTE — Telephone Encounter (Signed)
 Hi Mo, Can we help this patient change/cancel her appointment? Thanks!

## 2023-11-15 ENCOUNTER — Ambulatory Visit: Payer: BC Managed Care – PPO | Admitting: Dermatology

## 2023-12-20 ENCOUNTER — Encounter: Payer: Self-pay | Admitting: Family Medicine

## 2023-12-21 ENCOUNTER — Ambulatory Visit: Admitting: Physician Assistant

## 2024-01-07 ENCOUNTER — Ambulatory Visit: Admitting: Dermatology

## 2024-01-07 ENCOUNTER — Encounter: Payer: Self-pay | Admitting: Dermatology

## 2024-01-07 VITALS — BP 84/60

## 2024-01-07 DIAGNOSIS — Z79899 Other long term (current) drug therapy: Secondary | ICD-10-CM | POA: Diagnosis not present

## 2024-01-07 DIAGNOSIS — L2089 Other atopic dermatitis: Secondary | ICD-10-CM

## 2024-01-07 DIAGNOSIS — L209 Atopic dermatitis, unspecified: Secondary | ICD-10-CM | POA: Diagnosis not present

## 2024-01-07 MED ORDER — HYDROCORTISONE 2.5 % EX CREA: TOPICAL_CREAM | Freq: Two times a day (BID) | CUTANEOUS | 3 refills | Status: DC | PRN

## 2024-01-07 NOTE — Progress Notes (Unsigned)
   Follow-Up Visit   Subjective  Jaretzi Droz is a 38 y.o. female who presents for the following: Eczema follow up - She is doing better since starting Dupixent  injections. She takes an injection every 2 weeks. She still has a few flares of face and she uses TMC 0.1% cream for those. She was getting Dupixent  delivered but they stopped about 2 months ago. They need a PA to continue.    The following portions of the chart were reviewed this encounter and updated as appropriate: medications, allergies, medical history  Review of Systems:  No other skin or systemic complaints except as noted in HPI or Assessment and Plan.  Objective  Well appearing patient in no apparent distress; mood and affect are within normal limits.   A focused examination was performed of the following areas:   Relevant exam findings are noted in the Assessment and Plan.    Assessment & Plan   1. Atopic Dermatitis - Assessment: Patient is currently on Dupixent  therapy, providing some benefit but not complete resolution. Experiences flares approximately every 1-2 months, primarily affecting the face, lasting 4-5 days and managed with triamcinolone . Reports 10-15 days of clear skin between flares. Uses Vanicream sunscreen and moisturizer on face but no topical corticosteroids. Rest of body currently unaffected. BSA: 2%, IGA:1  - Plan:    Continue Dupixent  therapy    Adjust topical regimen:     - Discontinue triamcinolone  use on face     - Start hydrocortisone 2.5% ointment for facial flares       - Use for up to 7 days until clear     - Continue Vanicream moisturizer morning and night    Skincare recommendations:     - Use mineral sunscreens (zinc oxide or titanium dioxide)     - Consider Avene Cyclophate Balm for sensitive skin     - Defer starting Vanicream Vitamin C Serum until skin is calm    Dupixent  management:     - Provide sample for next Dupixent  injection     - Submit today's notes for insurance  approval of Dupixent  for one year        No follow-ups on file.  I, Roseline Hutchinson, CMA, am acting as scribe for [employer], DO .   Documentation: I have reviewed the above documentation for accuracy and completeness, and I agree with the above.  Delon Lenis, DO

## 2024-01-07 NOTE — Patient Instructions (Addendum)
 Date: Mon Jan 07 2024  Hello Alyssa Rich,  Thank you for visiting today. Here is a summary of the key instructions:  Medications: - Stop using triamcinolone  on your face - Use hydrocortisone 2.5% ointment on your face for up to 7 days until clear - Use cortisone and lots of moisturizer for white patches  Skin Care: - Continue using Vanicream sunscreen and moisturizer morning and night - Use mineral sunscreens with zinc oxide or titanium dioxide - Try Avene's Cyclophate Balm for sensitive skin - Wait to use Vanicream Vitamin C Serum until your skin is calm  Follow-up: - Return for next injection (sample will be provided) - Follow-up appointment in 6 months  Please reach out if you have any questions or concerns.  Warm regards,  Dr. [facility]  Important Information  Due to recent changes in healthcare laws, you may see results of your pathology and/or laboratory studies on MyChart before the doctors have had a chance to review them. We understand that in some cases there may be results that are confusing or concerning to you. Please understand that not all results are received at the same time and often the doctors may need to interpret multiple results in order to provide you with the best plan of care or course of treatment. Therefore, we ask that you please give us  2 business days to thoroughly review all your results before contacting the office for clarification. Should we see a critical lab result, you will be contacted sooner.   If You Need Anything After Your Visit  If you have any questions or concerns for your doctor, please call our main line at [phone] If no one answers, please leave a voicemail as directed and we will return your call as soon as possible. Messages left after 4 pm will be answered the following business day.   You may also send us  a message via MyChart. We typically respond to MyChart messages within 1-2 business days.  For  prescription refills, please ask your pharmacy to contact our office. Our fax number is [phone].  If you have an urgent issue when the clinic is closed that cannot wait until the next business day, you can page your doctor at the number below.    Please note that while we do our best to be available for urgent issues outside of office hours, we are not available 24/7.   If you have an urgent issue and are unable to reach us , you may choose to seek medical care at your doctor's office, retail clinic, urgent care center, or emergency room.  If you have a medical emergency, please immediately call 911 or go to the emergency department. In the event of inclement weather, please call our main line at [phone] for an update on the status of any delays or closures.  Dermatology Medication Tips: Please keep the boxes that topical medications come in in order to help keep track of the instructions about where and how to use these. Pharmacies typically print the medication instructions only on the boxes and not directly on the medication tubes.   If your medication is too expensive, please contact our office at [phone] or send us  a message through MyChart.   We are unable to tell what your co-pay for medications will be in advance as this is different depending on your insurance coverage. However, we may be able to find a substitute medication at lower cost or fill out paperwork to get insurance to cover a  needed medication.   If a prior authorization is required to get your medication covered by your insurance company, please allow us  1-2 business days to complete this process.  Drug prices often vary depending on where the prescription is filled and some pharmacies may offer cheaper prices.  The website www.goodrx.com contains coupons for medications through different pharmacies. The prices here do not account for what the cost may be with help from insurance (it may be cheaper with your  insurance), but the website can give you the price if you did not use any insurance.  - You can print the associated coupon and take it with your prescription to the pharmacy.  - You may also stop by our office during regular business hours and pick up a GoodRx coupon card.  - If you need your prescription sent electronically to a different pharmacy, notify our office through [facility] or by phone at [phone]

## 2024-01-08 MED ORDER — HYDROCORTISONE 2.5 % EX CREA: TOPICAL_CREAM | Freq: Two times a day (BID) | CUTANEOUS | 3 refills | Status: DC

## 2024-01-10 ENCOUNTER — Other Ambulatory Visit: Payer: Self-pay

## 2024-01-10 DIAGNOSIS — L309 Dermatitis, unspecified: Secondary | ICD-10-CM

## 2024-01-10 MED ORDER — DUPIXENT 300 MG/2ML ~~LOC~~ SOAJ: 300.0000 mg | SUBCUTANEOUS | 6 refills | Status: DC

## 2024-03-31 ENCOUNTER — Ambulatory Visit: Payer: Self-pay

## 2024-03-31 ENCOUNTER — Encounter: Payer: Self-pay | Admitting: Family Medicine

## 2024-03-31 NOTE — Telephone Encounter (Signed)
 Appt scheduled

## 2024-03-31 NOTE — Telephone Encounter (Signed)
 FYI Only or Action Required?: FYI only for provider.  Patient was last seen in primary care on 07/25/2023 by Alyssa Mabel Mt, DO.  Called Nurse Triage reporting Shoulder Pain.  Symptoms began several years ago.  Interventions attempted: Ice/heat application.  Symptoms are: gradually worsening.  Triage Disposition: See PCP When Office is Open (Within 3 Days)  Patient/caregiver understands and will follow disposition?: Yes     Copied from CRM [id number]. Topic: Clinical - Red Word Triage >> Mar 31, 2024  1:53 PM Robinson H wrote: [facility] that prompted transfer to Nurse Triage: Right shoulder pain, if she presses specific point on shoulder has pain, if she laughs pain in shoulder and neck, no falls, had issues for while but getting worse Reason for Disposition  [1] MODERATE pain (e.g., interferes with normal activities) AND [2] present > 3 days  Answer Assessment - Initial Assessment Questions Patient states symptoms are worsening and affecting her day to day activities. Pain radiates to R side of neck and  R side of back. Pt uses an ice pack for symptoms.     1. ONSET: When did the pain start?     4-5 years  2. LOCATION: Where is the pain located?     R shoulder  3. PAIN: How bad is the pain? (Scale 1-10; or mild, moderate, severe)     7/10 4. CAUSE: What do you think is causing the shoulder pain?     Unknown  5. OTHER SYMPTOMS: Do you have any other symptoms? (e.g., neck pain, swelling, rash, fever, numbness, weakness)     Denies swelling, rash, fever, numbness, weakness.  Protocols used: Shoulder Pain-A-AH

## 2024-04-02 ENCOUNTER — Ambulatory Visit: Admitting: Family Medicine

## 2024-04-02 ENCOUNTER — Encounter: Payer: Self-pay | Admitting: Family Medicine

## 2024-04-02 ENCOUNTER — Ambulatory Visit (HOSPITAL_BASED_OUTPATIENT_CLINIC_OR_DEPARTMENT_OTHER)
Admission: RE | Admit: 2024-04-02 | Discharge: 2024-04-02 | Disposition: A | Discharge status: Home / Self Care | Source: Ambulatory Visit | Attending: Family Medicine | Admitting: Family Medicine

## 2024-04-02 VITALS — BP 102/63 | HR 86 | Ht 61.0 in | Wt 111.0 lb

## 2024-04-02 DIAGNOSIS — G8929 Other chronic pain: Secondary | ICD-10-CM | POA: Diagnosis not present

## 2024-04-02 DIAGNOSIS — M25511 Pain in right shoulder: Secondary | ICD-10-CM | POA: Diagnosis not present

## 2024-04-02 MED ORDER — MELOXICAM 15 MG PO TABS: 15.0000 mg | ORAL_TABLET | Freq: Every day | ORAL | 1 refills | Status: AC

## 2024-04-02 NOTE — Progress Notes (Signed)
 Acute Office Visit  Subjective:     Patient ID: Alyssa Rich, female    DOB: 30-Aug-1985, 38 y.o.   MRN: [id number]  Chief Complaint  Patient presents with   Shoulder Pain    Shoulder Pain    Patient is in today for right shoulder pain.  Discussed the use of AI scribe software for clinical note transcription with the patient, who gave verbal consent to proceed.  History of Present Illness Alyssa Rich is a 37 year old female who presents with worsening right shoulder pain.  She has experienced right shoulder pain for several years, initially evaluated with a normal x-ray at [facility] in 2020. She consulted an orthopedic specialist and underwent physical therapy, which provided some improvement but not significant relief.  Recently, the pain has worsened without any specific injury or activity to trigger it. The pain is located posterior of the right shoulder, with overhead movements being particularly painful. She also experiences weakness in the arm, affecting her ability to carry groceries and perform normal activities. She rates her current pain as a 6 out of 10. Rest and Tylenol  provide some relief. No swelling, bruising, popping, clicking, or locking in the shoulder. The pain does not radiate. She feels tightness in the upper trap area.  She works at [employer], primarily Contractor duties, and does not engage in heavy lifting.          ROS All review of systems negative except what is listed in the HPI      Objective:    BP 102/63   Pulse 86   Ht 5' 1 (1.549 m)   Wt 111 lb (50.3 kg)   SpO2 100%   BMI 20.97 kg/m    Physical Exam Vitals reviewed.  Constitutional:      Appearance: Normal appearance.  Musculoskeletal:        General: Normal range of motion.     Cervical Rich: Normal range of motion and neck supple.     Comments: Right upper trap tension; some shoulder pain with Neers/Empty Can, normal passive ROM  Skin:    General: Skin  is warm and dry.  Neurological:     Mental Status: She is alert and oriented to person, place, and time.  Psychiatric:        Mood and Affect: Mood normal.        Thought Content: Thought content normal.        Judgment: Judgment normal.          No results found for any visits on 04/02/24.      Assessment & Plan:   Problem List Items Addressed This Visit   None Visit Diagnoses       Chronic right shoulder pain    -  Primary   Relevant Medications   meloxicam  (MOBIC ) 15 MG tablet   Other Relevant Orders   Ambulatory referral to Sports Medicine   DG Shoulder Right       Assessment & Plan Right shoulder pain Chronic right shoulder pain with recent exacerbation, likely due to rotator cuff tendinopathy or impingement. Previous imaging normal, updated imaging needed. - Prescribed meloxicam  once daily for 2-3 weeks. Advised to avoid additional NSAIDs. - Provided handout with exercises. - Advised use of a heating pad. - Referred to sports medicine for further evaluation and possible injection. - Ordered updated x-ray of the right shoulder.         Meds ordered this encounter  Medications  meloxicam  (MOBIC ) 15 MG tablet    Sig: Take 1 tablet (15 mg total) by mouth daily.    Dispense:  30 tablet    Refill:  1    Supervising Provider:   DOMENICA BLACKBIRD A [[id number]]    Return if symptoms worsen or fail to improve.  Waddell KATHEE Mon, NP

## 2024-04-08 ENCOUNTER — Ambulatory Visit: Payer: Self-pay | Admitting: Family Medicine

## 2024-04-17 ENCOUNTER — Other Ambulatory Visit (HOSPITAL_BASED_OUTPATIENT_CLINIC_OR_DEPARTMENT_OTHER): Payer: Self-pay

## 2024-04-17 ENCOUNTER — Ambulatory Visit

## 2024-04-17 ENCOUNTER — Other Ambulatory Visit: Payer: Self-pay

## 2024-04-17 VITALS — Ht 60.0 in | Wt 109.0 lb

## 2024-04-17 DIAGNOSIS — G540 Brachial plexus disorders: Secondary | ICD-10-CM

## 2024-04-17 DIAGNOSIS — G8929 Other chronic pain: Secondary | ICD-10-CM

## 2024-04-17 DIAGNOSIS — M25511 Pain in right shoulder: Secondary | ICD-10-CM | POA: Diagnosis not present

## 2024-04-17 NOTE — Progress Notes (Unsigned)
   Subjective:    Patient ID: Alyssa Rich, female    DOB: 38 y.o., December 27, 1985   MRN: [id number]  HPI  Chief Complaint: R shoulder pain  Patient previously evaluated by Alyssa Rich, her primary care NP, for this problem.  Prescribed meloxicam  for 2-[redacted] weeks along with HEP and heating pad recommended. X-rays obtained at that time  Review of pertinent imaging: 04/02/2024 three-view plain from radiographs obtained of the right shoulder, independent evaluation and review revealing largely well-preserved joint spaces, no high riding humerus, no AC changes, type II acromion  Today she reports this has been bothering her for 5 years Worse past 15 days. Feels point tender over the posterior/superior musculature of her R shoulder. No known inciting trigger for this worsening. Generally worse with overhead motions. Feels like arm is weak affecting her ability to carry groceries and perform normal activities daily. 6 out of 10 pain.     Objective:   Physical Exam There were no vitals filed for this visit.  right Shoulder ( compared to normal ) Inspection: *** swelling, *** scapular dyskinesis Palpation: TTP *** greater tuberosity, *** AC joint, *** biceps tendon, *** posterior shoulder AROM/PROM: 180*** forward flexion, 180*** abduction, 60*** external rotation, internal rotation to lumbar spine Strength: 5/5 lift off, 5/5 empty can, 5/5 external rotation, 5/5 flexion, *** drop arm test Special tests:    -Rotator Cuff: *** Neer's, *** Hawkin's, *** empty can, *** painful arc   -Labrum: *** O'brien's, *** Jerk   -Biceps: *** speed's, *** yergason's    -AC Joint: *** cross arm testing     -Instability: *** external rotation/apprehension/relocation test, *** sulcus sign  Limited ultrasound examination of the posterior right shoulder  Tenderness to sono palpation    Assessment & Plan:   Alyssa Rich is a 38 y.o. ***

## 2024-04-28 ENCOUNTER — Other Ambulatory Visit: Payer: Self-pay | Admitting: Dermatology

## 2024-04-28 DIAGNOSIS — L309 Dermatitis, unspecified: Secondary | ICD-10-CM

## 2024-05-15 ENCOUNTER — Ambulatory Visit

## 2024-06-11 ENCOUNTER — Ambulatory Visit: Payer: Self-pay

## 2024-06-11 NOTE — Telephone Encounter (Signed)
 FYI Only or Action Required?: FYI only for provider: ED advised.  Patient was last seen in primary care on 04/02/2024 by Almarie Waddell NOVAK, NP.  Called Nurse Triage reporting Chest Pain.  Symptoms began about a month ago.  Interventions attempted: Nothing.  Symptoms are: gradually worsening.  Triage Disposition: Go to ED Now (Notify PCP)  Patient/caregiver understands and will follow disposition?: Yes   Copied from CRM #[id number]. Topic: Clinical - Red Word Triage >> Jun 11, 2024  2:19 PM Taleah C wrote: Red Word that prompted transfer to Nurse Triage: Chest Pain & heavy breathing Reason for Disposition  [1] Chest pain (or angina) comes and goes AND [2] is happening more often (increasing in frequency) or getting worse (increasing in severity)  (Exception: Chest pains that last only a few seconds.)  Answer Assessment - Initial Assessment Questions Pt states symptoms began about a month ago. She is having left sided pinching in her chest. She states they can last 10-30 minutes and sometimes it radiates to her shoulder and back. She states she is also having shortness of breath all the time not just these episodes and worse with exertion. She states that she can take a light walk and feel winded. Rn can tell by voice sounds a little short of breath. She states the last time she had the chest pain was this morning 3/10. Rn advised pt to go to the ER as these episodes are continuing to happen, its radiating to her left shoulder and back, and she is having shortness of breath. Pt stated understanding.       1. LOCATION: Where does it hurt?       Left chest 2. RADIATION: Does the pain go anywhere else? (e.g., into neck, jaw, arms, back)     Left shoulder and back 3. ONSET: When did the chest pain begin? (Minutes, hours or days)      About a month ago 4. PATTERN: Does the pain come and go, or has it been constant since it started?  Does it get worse with exertion?       intermittent 5. DURATION: How long does it last (e.g., seconds, minutes, hours)     10-30 minutes 6. SEVERITY: How bad is the pain?  (e.g., Scale 1-10; mild, moderate, or severe)     3/10 7. CARDIAC RISK FACTORS: Do you have any history of heart problems or risk factors for heart disease? (e.g., angina, prior heart attack; diabetes, high blood pressure, high cholesterol, smoker, or strong family history of heart disease)     High cholesterol 8. PULMONARY RISK FACTORS: Do you have any history of lung disease?  (e.g., blood clots in lung, asthma, emphysema, birth control pills)     no 9. CAUSE: What do you think is causing the chest pain?     unknown 10. OTHER SYMPTOMS: Do you have any other symptoms? (e.g., dizziness, nausea, vomiting, sweating, fever, difficulty breathing, cough)       Shortness of breath  Protocols used: Chest Pain-A-AH

## 2024-06-11 NOTE — Telephone Encounter (Signed)
FYI. Pt going to ED.  

## 2024-06-23 ENCOUNTER — Encounter: Payer: Self-pay | Admitting: Dermatology

## 2024-06-23 ENCOUNTER — Ambulatory Visit: Admitting: Family Medicine

## 2024-06-23 ENCOUNTER — Ambulatory Visit (HOSPITAL_BASED_OUTPATIENT_CLINIC_OR_DEPARTMENT_OTHER)
Admission: RE | Admit: 2024-06-23 | Discharge: 2024-06-23 | Disposition: A | Source: Ambulatory Visit | Attending: Family Medicine | Admitting: Family Medicine

## 2024-06-23 ENCOUNTER — Encounter: Payer: Self-pay | Admitting: Family Medicine

## 2024-06-23 VITALS — BP 100/68 | HR 84 | Temp 97.9°F | Resp 16 | Ht 60.0 in | Wt 113.6 lb

## 2024-06-23 DIAGNOSIS — M25562 Pain in left knee: Secondary | ICD-10-CM | POA: Diagnosis not present

## 2024-06-23 DIAGNOSIS — R918 Other nonspecific abnormal finding of lung field: Secondary | ICD-10-CM | POA: Diagnosis not present

## 2024-06-23 DIAGNOSIS — R079 Chest pain, unspecified: Secondary | ICD-10-CM

## 2024-06-23 DIAGNOSIS — J40 Bronchitis, not specified as acute or chronic: Secondary | ICD-10-CM | POA: Diagnosis not present

## 2024-06-23 DIAGNOSIS — G8929 Other chronic pain: Secondary | ICD-10-CM

## 2024-06-23 DIAGNOSIS — M25511 Pain in right shoulder: Secondary | ICD-10-CM | POA: Diagnosis not present

## 2024-06-23 DIAGNOSIS — M25561 Pain in right knee: Secondary | ICD-10-CM

## 2024-06-23 DIAGNOSIS — R0602 Shortness of breath: Secondary | ICD-10-CM | POA: Diagnosis not present

## 2024-06-23 MED ORDER — FAMOTIDINE 20 MG PO TABS: 20.0000 mg | ORAL_TABLET | Freq: Two times a day (BID) | ORAL | 2 refills | Status: DC

## 2024-06-23 NOTE — Progress Notes (Signed)
 Subjective:    Patient ID: Alyssa Rich, female    DOB: December 20, 1985, 38 y.o.   MRN: [id number]  Chief Complaint  Patient presents with   Chest Pain    Pt states states having chest pain and SOB for 3 weeks to a month. Off and on.    HPI Patient is in today for cp and shortness of breath.  Discussed the use of AI scribe software for clinical note transcription with the patient, who gave verbal consent to proceed.  History of Present Illness Alyssa Rich is a 38 year old female who presents with chest pain and breathing difficulties.  She experiences chest pain, particularly after consuming meals like rice, fish, and vegetables. The pain is not consistently associated with exertion. No burping. She also reports heavy breathing, especially when walking faster.  She describes a burning sensation starting from the back of her head and spreading, which began two days ago.  She has soreness in her left eyeball, particularly when applying eyeliner, although her vision remains good. She has not had her eyes checked recently.  She has a history of knee and hand pain, specifically in the right shoulder area. Previous imaging, including an ultrasound for her hand and x-rays for her shoulder and knees, showed no findings.  She occasionally experiences a wheezing sound and a sensation similar to having a cold, which lasts for about an hour and then resolves. No runny nose or sneezing.  She works at [employer] and spends a lot of time on the computer. She lives seven minutes away from the clinic.    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    GERD (gastroesophageal reflux disease)     Past Surgical History:  Procedure Laterality Date   DILATION AND CURETTAGE OF UTERUS      Family History  Problem Relation Age of Onset   Hyperlipidemia Mother    Asthma Mother    Allergic rhinitis Mother    Diabetes Mother    Depression Mother    Hyperlipidemia  Father    Hyperlipidemia Brother     Social History   Socioeconomic History   Marital status: Married    Spouse name: Not on file   Number of children: 1   Years of education: Not on file   Highest education level: Not on file  Occupational History   Not on file  Tobacco Use   Smoking status: Never    Passive exposure: Never   Smokeless tobacco: Never  Vaping Use   Vaping status: Never Used  Substance and Sexual Activity   Alcohol use: Never   Drug use: Never   Sexual activity: Yes    Partners: Male    Birth control/protection: Pill  Other Topics Concern   Not on file  Social History Narrative   Exercise-- no    Lives with husband and daughter    In a house    No pets   Social Drivers of Corporate Investment Banker Strain: Not on file  Food Insecurity: Not on file  Transportation Needs: Not on file  Physical Activity: Not on file  Stress: Not on file  Social Connections: Not on file  Intimate Partner Violence: Not on file    Outpatient Medications Prior to Visit  Medication Sig Dispense Refill   desonide  (DESOWEN ) 0.05 % ointment Apply 1 Application topically 2 (two) times daily as needed (mild rash flare). Okay to use on the face, neck, groin  area. Do not use more than 1 week at a time. 60 g 2   DUPIXENT  300 MG/2ML SOAJ INJECT 300 MG (1 PEN) UNDER THE SKIN EVERY 2 WEEKS AS DIRECTED 4 mL 6   meloxicam  (MOBIC ) 15 MG tablet Take 1 tablet (15 mg total) by mouth daily. 30 tablet 1   No facility-administered medications prior to visit.    No Known Allergies  Review of Systems  Constitutional:  Negative for fever and malaise/fatigue.  HENT:  Positive for sinus pain and sore throat. Negative for congestion.   Eyes:  Negative for blurred vision.  Respiratory:  Positive for cough. Negative for shortness of breath.   Cardiovascular:  Negative for chest pain, palpitations and leg swelling.  Gastrointestinal:  Negative for vomiting.  Musculoskeletal:  Negative for  back pain.  Skin:  Negative for rash.  Neurological:  Negative for loss of consciousness and headaches.       Objective:    Physical Exam Vitals and nursing note reviewed.  Constitutional:      General: She is not in acute distress.    Appearance: Normal appearance. She is well-developed.  HENT:     Head: Normocephalic and atraumatic.  Eyes:     General: No scleral icterus.       Right eye: No discharge.        Left eye: No discharge.  Cardiovascular:     Rate and Rhythm: Normal rate and regular rhythm.     Heart sounds: No murmur heard. Pulmonary:     Effort: Pulmonary effort is normal. No respiratory distress.     Breath sounds: Normal breath sounds.  Musculoskeletal:        General: Normal range of motion.     Cervical back: Normal range of motion and neck supple.     Right lower leg: No edema.     Left lower leg: No edema.  Skin:    General: Skin is warm and dry.  Neurological:     Mental Status: She is alert and oriented to person, place, and time.  Psychiatric:        Mood and Affect: Mood normal.        Behavior: Behavior normal.        Thought Content: Thought content normal.        Judgment: Judgment normal.     BP 100/68 (BP Location: Left Arm, Patient Position: Sitting, Cuff Size: Normal)   Pulse 84   Temp 97.9 F (36.6 C) (Oral)   Resp 16   Ht 5' (1.524 m)   Wt 113 lb 9.6 oz (51.5 kg)   SpO2 100%   BMI 22.19 kg/m  Wt Readings from Last 3 Encounters:  06/23/24 113 lb 9.6 oz (51.5 kg)  04/17/24 109 lb (49.4 kg)  04/02/24 111 lb (50.3 kg)    Diabetic Foot Exam - Simple   No data filed    Lab Results  Component Value Date   WBC 7.6 07/19/2023   HGB 12.4 07/19/2023   HCT 38.3 07/19/2023   PLT 160.0 07/19/2023   GLUCOSE 82 07/19/2023   CHOL 258 (H) 07/19/2023   TRIG 177.0 (H) 07/19/2023   HDL 46.10 07/19/2023   LDLCALC 177 (H) 07/19/2023   ALT 16 07/19/2023   AST 17 07/19/2023   NA 136 07/19/2023   K 4.2 07/19/2023   CL 101 07/19/2023    CREATININE 0.73 07/19/2023   BUN 11 07/19/2023   CO2 25 07/19/2023   TSH 0.91 07/19/2023  HGBA1C 5.2 07/19/2023    Lab Results  Component Value Date   TSH 0.91 07/19/2023   Lab Results  Component Value Date   WBC 7.6 07/19/2023   HGB 12.4 07/19/2023   HCT 38.3 07/19/2023   MCV 80.3 07/19/2023   PLT 160.0 07/19/2023   Lab Results  Component Value Date   NA 136 07/19/2023   K 4.2 07/19/2023   CO2 25 07/19/2023   GLUCOSE 82 07/19/2023   BUN 11 07/19/2023   CREATININE 0.73 07/19/2023   BILITOT 0.5 07/19/2023   ALKPHOS 66 07/19/2023   AST 17 07/19/2023   ALT 16 07/19/2023   PROT 7.8 07/19/2023   ALBUMIN 4.8 07/19/2023   CALCIUM 9.8 07/19/2023   GFR 106.01 07/19/2023   Lab Results  Component Value Date   CHOL 258 (H) 07/19/2023   Lab Results  Component Value Date   HDL 46.10 07/19/2023   Lab Results  Component Value Date   LDLCALC 177 (H) 07/19/2023   Lab Results  Component Value Date   TRIG 177.0 (H) 07/19/2023   Lab Results  Component Value Date   CHOLHDL 6 07/19/2023   Lab Results  Component Value Date   HGBA1C 5.2 07/19/2023   EKG--- nsr     Assessment & Plan:  Chest pain, unspecified type -     EKG 12-Lead -     Comprehensive metabolic panel with GFR -     CBC with Differential/Platelet -     Lipid panel -     Hemoglobin A1c -     H. pylori breath test -     DG Chest 2 View; Future -     Famotidine ; Take 1 tablet (20 mg total) by mouth 2 (two) times daily.  Dispense: 30 tablet; Refill: 2  Chronic right shoulder pain -     Ambulatory referral to Sports Medicine  Chronic pain of right knee -     Ambulatory referral to Sports Medicine  Chronic pain of left knee -     Ambulatory referral to Sports Medicine  Assessment and Plan Assessment & Plan Chest pain likely due to gastroesophageal reflux disease   Intermittent chest pain associated with eating suggests GERD. EKG was normal, ruling out cardiac causes. Differential includes acid  reflux and possible H. pylori infection. Symptoms include wheezing and cough, possibly related to acid reflux or allergies. Ordered chest x-ray and blood work including H. pylori test. Prescribed medication for acid reflux. Advised to avoid caffeine, acidic foods, and peppermint. Suggested half caffeinated, half decaffeinated coffee to prevent headaches.  Chronic right shoulder pain   Pain is localized to one area and not radiating. No recent injury reported. Pain worsens with certain movements. Referred to sports medicine for further evaluation.  Chronic bilateral knee pain   Chronic bilateral knee pain is likely age-related. Previous imaging was negative for acute pathology. Referred to sports medicine for further evaluation.  Hyperlipidemia   Cholesterol levels are elevated. No significant lifestyle modifications reported. Discussed lifestyle modifications to manage cholesterol levels.    Arrick Dutton R Lowne Chase, DO

## 2024-06-24 LAB — CBC WITH DIFFERENTIAL/PLATELET
Basophils Absolute: 0 K/uL (ref 0.0–0.1)
Basophils Relative: 0.4 % (ref 0.0–3.0)
Eosinophils Absolute: 0.1 K/uL (ref 0.0–0.7)
Eosinophils Relative: 1.9 % (ref 0.0–5.0)
HCT: 36.9 % (ref 36.0–46.0)
Hemoglobin: 12.2 g/dL (ref 12.0–15.0)
Lymphocytes Relative: 28.8 % (ref 12.0–46.0)
Lymphs Abs: 1 K/uL (ref 0.7–4.0)
MCHC: 33.1 g/dL (ref 30.0–36.0)
MCV: 80.1 fl (ref 78.0–100.0)
Monocytes Absolute: 0.2 K/uL (ref 0.1–1.0)
Monocytes Relative: 4.7 % (ref 3.0–12.0)
Neutro Abs: 2.3 K/uL (ref 1.4–7.7)
Neutrophils Relative %: 64.2 % (ref 43.0–77.0)
Platelets: 117 K/uL — ABNORMAL LOW (ref 150.0–400.0)
RBC: 4.61 Mil/uL (ref 3.87–5.11)
RDW: 13.7 % (ref 11.5–15.5)
WBC: 3.6 K/uL — ABNORMAL LOW (ref 4.0–10.5)

## 2024-06-24 LAB — H. PYLORI BREATH TEST: H. pylori Breath Test: NOT DETECTED

## 2024-06-24 LAB — COMPREHENSIVE METABOLIC PANEL WITH GFR
ALT: 18 U/L (ref 0–35)
AST: 19 U/L (ref 0–37)
Albumin: 4.7 g/dL (ref 3.5–5.2)
Alkaline Phosphatase: 71 U/L (ref 39–117)
BUN: 10 mg/dL (ref 6–23)
CO2: 30 meq/L (ref 19–32)
Calcium: 9.5 mg/dL (ref 8.4–10.5)
Chloride: 100 meq/L (ref 96–112)
Creatinine, Ser: 0.69 mg/dL (ref 0.40–1.20)
GFR: 111.14 mL/min (ref >60.00)
Glucose, Bld: 92 mg/dL (ref 70–99)
Potassium: 3.5 meq/L (ref 3.5–5.1)
Sodium: 137 meq/L (ref 135–145)
Total Bilirubin: 0.3 mg/dL (ref 0.2–1.2)
Total Protein: 7.5 g/dL (ref 6.0–8.3)

## 2024-06-24 LAB — LIPID PANEL
Cholesterol: 269 mg/dL — ABNORMAL HIGH (ref 0–200)
HDL: 47.4 mg/dL (ref >39.00)
NonHDL: 221.11
Total CHOL/HDL Ratio: 6
Triglycerides: 586 mg/dL — ABNORMAL HIGH (ref 0.0–149.0)
VLDL: 117.2 mg/dL — ABNORMAL HIGH (ref 0.0–40.0)

## 2024-06-24 LAB — HEMOGLOBIN A1C: Hgb A1c MFr Bld: 4.9 % (ref 4.6–6.5)

## 2024-06-24 LAB — LDL CHOLESTEROL, DIRECT: Direct LDL: 168 mg/dL

## 2024-06-26 ENCOUNTER — Ambulatory Visit: Payer: Self-pay | Admitting: Family Medicine

## 2024-07-02 ENCOUNTER — Ambulatory Visit: Admitting: Dermatology

## 2024-07-15 ENCOUNTER — Ambulatory Visit: Admitting: Family Medicine

## 2024-07-15 ENCOUNTER — Encounter: Payer: Self-pay | Admitting: Family Medicine

## 2024-07-15 ENCOUNTER — Other Ambulatory Visit (HOSPITAL_BASED_OUTPATIENT_CLINIC_OR_DEPARTMENT_OTHER): Payer: Self-pay

## 2024-07-15 VITALS — BP 108/78 | HR 86 | Temp 98.4°F | Resp 16 | Ht 60.0 in | Wt 112.4 lb

## 2024-07-15 DIAGNOSIS — J40 Bronchitis, not specified as acute or chronic: Secondary | ICD-10-CM | POA: Diagnosis not present

## 2024-07-15 DIAGNOSIS — J069 Acute upper respiratory infection, unspecified: Secondary | ICD-10-CM | POA: Diagnosis not present

## 2024-07-15 DIAGNOSIS — J101 Influenza due to other identified influenza virus with other respiratory manifestations: Secondary | ICD-10-CM | POA: Insufficient documentation

## 2024-07-15 LAB — POCT INFLUENZA A/B
Influenza A, POC: POSITIVE — AB
Influenza B, POC: NEGATIVE

## 2024-07-15 LAB — POC COVID19 BINAXNOW: SARS Coronavirus 2 Ag: NEGATIVE

## 2024-07-15 MED ORDER — OSELTAMIVIR PHOSPHATE 75 MG PO CAPS
75.0000 mg | ORAL_CAPSULE | Freq: Two times a day (BID) | ORAL | 0 refills | Status: AC
  Filled 2024-07-15: qty 10, 5d supply, fill #0

## 2024-07-15 MED ORDER — AZITHROMYCIN 250 MG PO TABS
ORAL_TABLET | ORAL | 0 refills | Status: AC
  Filled 2024-07-15: qty 6, 5d supply, fill #0

## 2024-07-15 NOTE — Progress Notes (Signed)
 "  +             Subjective:    Patient ID: Alyssa Rich, female    DOB: May 15, 1986, 38 y.o.   MRN: [id number]  Chief Complaint  Patient presents with   Follow-up    HPI Patient is in today for uri symptoms.  Discussed the use of AI scribe software for clinical note transcription with the patient, who gave verbal consent to proceed.  History of Present Illness A 38 year old female presents with symptoms of a cold.  Symptoms began four days ago with a cough, followed by sneezing and a runny nose since yesterday. No fever, but she has body aches and experiences chest and head pain when coughing.  Her daughter was sick prior to her, with a fever and cough. She visited urgent care where a chest x-ray was performed to rule out pneumonia, which was negative. Her daughter is currently seeing a doctor for follow-up.  She is taking Advil  for symptom relief. No wheezing and minimal sputum production. No stuffy nose, but a runny nose and sore throat due to sneezing.    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    GERD (gastroesophageal reflux disease)     Past Surgical History:  Procedure Laterality Date   DILATION AND CURETTAGE OF UTERUS      Family History  Problem Relation Age of Onset   Hyperlipidemia Mother    Asthma Mother    Allergic rhinitis Mother    Diabetes Mother    Depression Mother    Hyperlipidemia Father    Hyperlipidemia Brother     Social History   Socioeconomic History   Marital status: Married    Spouse name: Not on file   Number of children: 1   Years of education: Not on file   Highest education level: Not on file  Occupational History   Not on file  Tobacco Use   Smoking status: Never    Passive exposure: Never   Smokeless tobacco: Never  Vaping Use   Vaping status: Never Used  Substance and Sexual Activity   Alcohol use: Never   Drug use: Never   Sexual activity: Yes    Partners: Male    Birth  control/protection: Pill  Other Topics Concern   Not on file  Social History Narrative   Exercise-- no    Lives with husband and daughter    In a house    No pets   Social Drivers of Health   Tobacco Use: Low Risk (07/15/2024)   Patient History    Smoking Tobacco Use: Never    Smokeless Tobacco Use: Never    Passive Exposure: Never  Financial Resource Strain: Not on file  Food Insecurity: Not on file  Transportation Needs: Not on file  Physical Activity: Not on file  Stress: Not on file  Social Connections: Not on file  Intimate Partner Violence: Not on file  Depression (PHQ2-9): Medium Risk (06/23/2024)   Depression (PHQ2-9)    PHQ-2 Score: 7  Alcohol Screen: Not on file  Housing: Not on file  Utilities: Not on file  Health Literacy: Not on file    Outpatient Medications Prior to Visit  Medication Sig Dispense Refill   desonide  (DESOWEN ) 0.05 % ointment Apply 1 Application topically 2 (two) times daily as needed (mild rash flare). Okay to use on the face, neck, groin area. Do not use more than 1 week at a time. 60 g 2  DUPIXENT  300 MG/2ML SOAJ INJECT 300 MG (1 PEN) UNDER THE SKIN EVERY 2 WEEKS AS DIRECTED 4 mL 6   famotidine  (PEPCID ) 20 MG tablet Take 1 tablet (20 mg total) by mouth 2 (two) times daily. 30 tablet 2   meloxicam  (MOBIC ) 15 MG tablet Take 1 tablet (15 mg total) by mouth daily. 30 tablet 1   No facility-administered medications prior to visit.    Allergies[1]  Review of Systems  Constitutional:  Negative for fever and malaise/fatigue.  HENT:  Positive for sore throat. Negative for congestion.   Eyes:  Negative for blurred vision.  Respiratory:  Positive for cough and sputum production. Negative for shortness of breath and wheezing.   Cardiovascular:  Negative for chest pain, palpitations and leg swelling.  Gastrointestinal:  Negative for vomiting.  Musculoskeletal:  Negative for back pain.  Skin:  Negative for rash.  Neurological:  Negative for loss  of consciousness and headaches.       Objective:    Physical Exam Vitals and nursing note reviewed.  Constitutional:      General: She is not in acute distress.    Appearance: Normal appearance. She is well-developed. She is not ill-appearing, toxic-appearing or diaphoretic.  HENT:     Head: Normocephalic and atraumatic.  Eyes:     General: No scleral icterus.       Right eye: No discharge.        Left eye: No discharge.  Cardiovascular:     Rate and Rhythm: Normal rate and regular rhythm.     Heart sounds: No murmur heard. Pulmonary:     Effort: Pulmonary effort is normal. No respiratory distress.     Breath sounds: Normal breath sounds.  Musculoskeletal:        General: Normal range of motion.     Cervical back: Normal range of motion and neck supple.     Right lower leg: No edema.     Left lower leg: No edema.  Lymphadenopathy:     Cervical: No cervical adenopathy.  Skin:    General: Skin is warm and dry.  Neurological:     Mental Status: She is alert and oriented to person, place, and time.  Psychiatric:        Mood and Affect: Mood normal.        Behavior: Behavior normal.        Thought Content: Thought content normal.        Judgment: Judgment normal.     BP 108/78 (BP Location: Left Arm, Patient Position: Sitting, Cuff Size: Normal)   Pulse 86   Temp 98.4 F (36.9 C) (Oral)   Resp 16   Ht 5' (1.524 m)   Wt 112 lb 6.4 oz (51 kg)   SpO2 99%   BMI 21.95 kg/m  Wt Readings from Last 3 Encounters:  07/15/24 112 lb 6.4 oz (51 kg)  06/23/24 113 lb 9.6 oz (51.5 kg)  04/17/24 109 lb (49.4 kg)    Diabetic Foot Exam - Simple   No data filed    Lab Results  Component Value Date   WBC 3.6 (L) 06/23/2024   HGB 12.2 06/23/2024   HCT 36.9 06/23/2024   PLT 117.0 (L) 06/23/2024   GLUCOSE 92 06/23/2024   CHOL 269 (H) 06/23/2024   TRIG (H) 06/23/2024    586.0 Triglyceride is over 400; calculations on Lipids are invalid.   HDL 47.40 06/23/2024   LDLDIRECT  168.0 06/23/2024   LDLCALC 177 (H) 07/19/2023  ALT 18 06/23/2024   AST 19 06/23/2024   NA 137 06/23/2024   K 3.5 06/23/2024   CL 100 06/23/2024   CREATININE 0.69 06/23/2024   BUN 10 06/23/2024   CO2 30 06/23/2024   TSH 0.91 07/19/2023   HGBA1C 4.9 06/23/2024    Lab Results  Component Value Date   TSH 0.91 07/19/2023   Lab Results  Component Value Date   WBC 3.6 (L) 06/23/2024   HGB 12.2 06/23/2024   HCT 36.9 06/23/2024   MCV 80.1 06/23/2024   PLT 117.0 (L) 06/23/2024   Lab Results  Component Value Date   NA 137 06/23/2024   K 3.5 06/23/2024   CO2 30 06/23/2024   GLUCOSE 92 06/23/2024   BUN 10 06/23/2024   CREATININE 0.69 06/23/2024   BILITOT 0.3 06/23/2024   ALKPHOS 71 06/23/2024   AST 19 06/23/2024   ALT 18 06/23/2024   PROT 7.5 06/23/2024   ALBUMIN 4.7 06/23/2024   CALCIUM 9.5 06/23/2024   GFR 111.14 06/23/2024   Lab Results  Component Value Date   CHOL 269 (H) 06/23/2024   Lab Results  Component Value Date   HDL 47.40 06/23/2024   Lab Results  Component Value Date   LDLCALC 177 (H) 07/19/2023   Lab Results  Component Value Date   TRIG (H) 06/23/2024    586.0 Triglyceride is over 400; calculations on Lipids are invalid.   Lab Results  Component Value Date   CHOLHDL 6 06/23/2024   Lab Results  Component Value Date   HGBA1C 4.9 06/23/2024   Inf A -- positive    Xray--+bronchitis Assessment & Plan:  Viral upper respiratory infection -     POCT Influenza A/B -     POC COVID-19 BinaxNow  Influenza A Assessment & Plan: Tamiflu x5 days  Saline spray, rest, fluids Call or return to office as needed   Orders: -     Oseltamivir Phosphate; Take 1 capsule (75 mg total) by mouth 2 (two) times daily.  Dispense: 10 capsule; Refill: 0  Bronchitis -     Azithromycin; Take 2 tablets on day 1, then 1 tablet daily on days 2 through 5  Dispense: 6 tablet; Refill: 0    Jamee JONELLE Shanks Chase, DO     [1] No Known Allergies  "

## 2024-07-15 NOTE — Assessment & Plan Note (Signed)
 Tamiflu x5 days  Saline spray, rest, fluids Call or return to office as needed

## 2024-08-05 ENCOUNTER — Other Ambulatory Visit: Payer: Self-pay | Admitting: Family Medicine

## 2024-08-05 DIAGNOSIS — R079 Chest pain, unspecified: Secondary | ICD-10-CM

## 2024-08-25 ENCOUNTER — Ambulatory Visit: Admitting: Dermatology
